# Patient Record
Sex: Male | Born: 1945 | Race: White | Hispanic: No | Marital: Married | Smoking: Never smoker
Health system: Southern US, Community
[De-identification: ages and names within clinical notes are randomized; demographics above are authoritative.]

## PROBLEM LIST (undated history)

## (undated) DIAGNOSIS — I10 Essential (primary) hypertension: Secondary | ICD-10-CM

## (undated) DIAGNOSIS — C801 Malignant (primary) neoplasm, unspecified: Secondary | ICD-10-CM

## (undated) DIAGNOSIS — K219 Gastro-esophageal reflux disease without esophagitis: Secondary | ICD-10-CM

## (undated) HISTORY — PX: HERNIA REPAIR: SHX51

---

## 2011-08-02 HISTORY — PX: ROBOT ASSISTED LAPAROSCOPIC RADICAL PROSTATECTOMY: SHX5141

## 2019-05-28 ENCOUNTER — Other Ambulatory Visit: Payer: Self-pay | Admitting: Orthopedic Surgery

## 2019-05-28 DIAGNOSIS — M170 Bilateral primary osteoarthritis of knee: Secondary | ICD-10-CM

## 2019-05-30 ENCOUNTER — Other Ambulatory Visit: Payer: Self-pay

## 2019-05-30 ENCOUNTER — Encounter (INDEPENDENT_AMBULATORY_CARE_PROVIDER_SITE_OTHER): Payer: Self-pay

## 2019-05-30 ENCOUNTER — Ambulatory Visit
Admission: RE | Admit: 2019-05-30 | Discharge: 2019-05-30 | Disposition: A | Discharge status: Home / Self Care | Payer: Medicare Other | Source: Ambulatory Visit | Attending: Orthopedic Surgery | Admitting: Orthopedic Surgery

## 2019-05-30 DIAGNOSIS — M170 Bilateral primary osteoarthritis of knee: Secondary | ICD-10-CM | POA: Diagnosis not present

## 2019-06-17 ENCOUNTER — Other Ambulatory Visit: Payer: Self-pay | Admitting: Orthopedic Surgery

## 2019-07-02 ENCOUNTER — Other Ambulatory Visit: Admission: RE | Admit: 2019-07-02 | Payer: Medicare Other | Source: Ambulatory Visit

## 2019-07-05 ENCOUNTER — Other Ambulatory Visit: Admission: RE | Admit: 2019-07-05 | Payer: Medicare Other | Source: Ambulatory Visit

## 2019-07-09 ENCOUNTER — Encounter: Admission: RE | Payer: Self-pay | Source: Home / Self Care

## 2019-07-09 ENCOUNTER — Inpatient Hospital Stay: Admission: RE | Admit: 2019-07-09 | Payer: Medicare Other | Source: Home / Self Care | Admitting: Orthopedic Surgery

## 2019-07-09 SURGERY — ARTHROPLASTY, KNEE, TOTAL
Anesthesia: Choice | Site: Knee | Laterality: Left

## 2019-07-24 ENCOUNTER — Other Ambulatory Visit: Payer: Self-pay | Admitting: Orthopedic Surgery

## 2019-07-31 ENCOUNTER — Inpatient Hospital Stay
Admission: RE | Admit: 2019-07-31 | Discharge: 2019-07-31 | Disposition: A | Discharge status: Home / Self Care | Payer: Medicare Other | Source: Ambulatory Visit

## 2019-07-31 ENCOUNTER — Encounter
Admission: RE | Admit: 2019-07-31 | Discharge: 2019-07-31 | Disposition: A | Discharge status: Home / Self Care | Payer: Medicare Other | Source: Ambulatory Visit | Attending: Orthopedic Surgery | Admitting: Orthopedic Surgery

## 2019-07-31 ENCOUNTER — Other Ambulatory Visit: Payer: Self-pay

## 2019-07-31 DIAGNOSIS — I10 Essential (primary) hypertension: Secondary | ICD-10-CM | POA: Insufficient documentation

## 2019-07-31 DIAGNOSIS — Z7982 Long term (current) use of aspirin: Secondary | ICD-10-CM | POA: Insufficient documentation

## 2019-07-31 DIAGNOSIS — Z01818 Encounter for other preprocedural examination: Secondary | ICD-10-CM | POA: Diagnosis present

## 2019-07-31 DIAGNOSIS — M170 Bilateral primary osteoarthritis of knee: Secondary | ICD-10-CM | POA: Diagnosis not present

## 2019-07-31 DIAGNOSIS — Z79899 Other long term (current) drug therapy: Secondary | ICD-10-CM | POA: Insufficient documentation

## 2019-07-31 DIAGNOSIS — E785 Hyperlipidemia, unspecified: Secondary | ICD-10-CM | POA: Diagnosis not present

## 2019-07-31 HISTORY — DX: Essential (primary) hypertension: I10

## 2019-07-31 HISTORY — DX: Gastro-esophageal reflux disease without esophagitis: K21.9

## 2019-07-31 HISTORY — DX: Malignant (primary) neoplasm, unspecified: C80.1

## 2019-07-31 LAB — URINALYSIS, ROUTINE W REFLEX MICROSCOPIC
Bilirubin Urine: NEGATIVE
Glucose, UA: NEGATIVE mg/dL
Hgb urine dipstick: NEGATIVE
Ketones, ur: NEGATIVE mg/dL
Leukocytes,Ua: NEGATIVE
Nitrite: NEGATIVE
Protein, ur: NEGATIVE mg/dL
Specific Gravity, Urine: 1.004 — ABNORMAL LOW (ref 1.005–1.030)
pH: 6 (ref 5.0–8.0)

## 2019-07-31 LAB — COMPREHENSIVE METABOLIC PANEL
ALT: 20 U/L (ref 0–44)
AST: 23 U/L (ref 15–41)
Albumin: 4.7 g/dL (ref 3.5–5.0)
Alkaline Phosphatase: 55 U/L (ref 38–126)
Anion gap: 12 (ref 5–15)
BUN: 23 mg/dL (ref 8–23)
CO2: 22 mmol/L (ref 22–32)
Calcium: 9.8 mg/dL (ref 8.9–10.3)
Chloride: 104 mmol/L (ref 98–111)
Creatinine, Ser: 1.17 mg/dL (ref 0.61–1.24)
GFR calc Af Amer: >60 mL/min (ref >60)
GFR calc non Af Amer: >60 mL/min (ref >60)
Glucose, Bld: 105 mg/dL — ABNORMAL HIGH (ref 70–99)
Potassium: 3.8 mmol/L (ref 3.5–5.1)
Sodium: 138 mmol/L (ref 135–145)
Total Bilirubin: 0.9 mg/dL (ref 0.3–1.2)
Total Protein: 7.3 g/dL (ref 6.5–8.1)

## 2019-07-31 LAB — CBC WITH DIFFERENTIAL/PLATELET
Abs Immature Granulocytes: 0.04 10*3/uL (ref 0.00–0.07)
Basophils Absolute: 0.1 10*3/uL (ref 0.0–0.1)
Basophils Relative: 1 %
Eosinophils Absolute: 0.6 10*3/uL — ABNORMAL HIGH (ref 0.0–0.5)
Eosinophils Relative: 7 %
HCT: 42.3 % (ref 39.0–52.0)
Hemoglobin: 15.4 g/dL (ref 13.0–17.0)
Immature Granulocytes: 1 %
Lymphocytes Relative: 24 %
Lymphs Abs: 2 10*3/uL (ref 0.7–4.0)
MCH: 30.9 pg (ref 26.0–34.0)
MCHC: 36.4 g/dL — ABNORMAL HIGH (ref 30.0–36.0)
MCV: 84.8 fL (ref 80.0–100.0)
Monocytes Absolute: 0.7 10*3/uL (ref 0.1–1.0)
Monocytes Relative: 9 %
Neutro Abs: 5 10*3/uL (ref 1.7–7.7)
Neutrophils Relative %: 58 %
Platelets: 206 10*3/uL (ref 150–400)
RBC: 4.99 MIL/uL (ref 4.22–5.81)
RDW: 12.4 % (ref 11.5–15.5)
WBC: 8.4 10*3/uL (ref 4.0–10.5)
nRBC: 0 % (ref 0.0–0.2)

## 2019-07-31 LAB — SURGICAL PCR SCREEN
MRSA, PCR: NEGATIVE
Staphylococcus aureus: NEGATIVE

## 2019-07-31 NOTE — Patient Instructions (Signed)
Your COVID swab is scheduled on:  Monday 08/05/2019. Drive up any time [id number] am in front of the [facility] and remain in your vehicle.  Your procedure is scheduled on: Tuesday 08/06/2019. Report to Same Day Surgery 2nd floor [facility] Entrance-take elevator on left to 2nd floor.  Check in with surgery information desk.) To find out your arrival time, call [phone] 1:00-3:00 PM on Monday 08/05/2019  Remember: Instructions that are not followed completely may result in serious medical risk, up to and including death, or upon the discretion of your surgeon and anesthesiologist your surgery may need to be rescheduled.    __x__ 1. Do not eat food (including mints, candies, chewing gum) after midnight the night before your procedure. You may drink clear liquids up to 2 hours before you are scheduled to arrive at the hospital for your procedure.  Do not drink anything within 2 hours of your scheduled arrival to the hospital.  Approved clear liquids:  --Water or Apple juice without pulp  --Clear carbohydrate beverage such as Gatorade or Powerade  --Black Coffee or Clear Tea (No milk, no creamers, do not add anything to the coffee or tea)   __x__ 2. Finish your provided clear Ensure carb drink 2 hours before your scheduled arrival.    __x__ 3. No Alcohol or Smoking 24 hours before surgery.     __x__ 4. Notify your doctor if there is any change in your medical condition (cold, fever, infections).   __x__ 5. On the morning of surgery brush your teeth with toothpaste and water.  You may rinse your mouth with mouthwash if you wish.  Do not swallow any toothpaste or mouthwash.  Please read over the following fact sheets that you were given:   [facility] Preparing for Surgery and/or MRSA Information    __x__ Use CHG Soap as directed on instruction sheet .   Do not wear jewelry, lotions, powders, deodorant, or perfumes on the day of surgery.  Do not shave below the  face/neck 48 hours prior to surgery.   Do not bring valuables to the hospital.  [facility] is not responsible for any belongings or valuables.               Eyeglasses may not be worn into surgery.  For patients discharged on the day of surgery, you will NOT be permitted to drive yourself home.  You must have a responsible adult with you for 24 hours after surgery.  __x__ Take these medicines on the morning of surgery with a SMALL SIP OF WATER:  1. Famotidine  __x__ If applicable, follow recommendations from Cardiologist, Pulmonologist or PCP regarding stopping blood thinners such as Aspirin, Coumadin, Plavix, Eliquis, Effient, Pradaxa, and Pletal.  __x__ STARTING TODAY: Do not take Anti-inflammatories such as Aleve, Naproxen, Advil, Ibuprofen, Motrin, Naprosyn, BC/Goodies powders or aspirin products. You CAN take Tylenol if needed   __x__ STARTING TODAY: Do not take any over the counter supplements until after surgery.

## 2019-08-05 ENCOUNTER — Other Ambulatory Visit
Admission: RE | Admit: 2019-08-05 | Discharge: 2019-08-05 | Disposition: A | Discharge status: Home / Self Care | Payer: Medicare Other | Source: Ambulatory Visit | Attending: Orthopedic Surgery | Admitting: Orthopedic Surgery

## 2019-08-05 ENCOUNTER — Other Ambulatory Visit: Payer: Self-pay

## 2019-08-05 DIAGNOSIS — Z01812 Encounter for preprocedural laboratory examination: Secondary | ICD-10-CM | POA: Insufficient documentation

## 2019-08-05 DIAGNOSIS — Z20822 Contact with and (suspected) exposure to covid-19: Secondary | ICD-10-CM | POA: Diagnosis not present

## 2019-08-05 LAB — SARS CORONAVIRUS 2 (TAT 6-24 HRS): SARS Coronavirus 2: NEGATIVE

## 2019-08-05 LAB — TYPE AND SCREEN
ABO/RH(D): A POS
Antibody Screen: NEGATIVE

## 2019-08-06 ENCOUNTER — Encounter
Admission: RE | Disposition: A | Discharge status: Home / Self Care | Payer: Self-pay | Source: Home / Self Care | Attending: Orthopedic Surgery

## 2019-08-06 ENCOUNTER — Ambulatory Visit
Admission: RE | Admit: 2019-08-06 | Discharge: 2019-08-06 | Disposition: A | Discharge status: Home / Self Care | Payer: Medicare Other | Attending: Orthopedic Surgery | Admitting: Orthopedic Surgery

## 2019-08-06 ENCOUNTER — Encounter: Payer: Self-pay | Admitting: Orthopedic Surgery

## 2019-08-06 ENCOUNTER — Other Ambulatory Visit: Payer: Self-pay

## 2019-08-06 ENCOUNTER — Ambulatory Visit: Payer: Medicare Other | Admitting: Anesthesiology

## 2019-08-06 ENCOUNTER — Ambulatory Visit: Payer: Medicare Other

## 2019-08-06 DIAGNOSIS — M1712 Unilateral primary osteoarthritis, left knee: Secondary | ICD-10-CM | POA: Diagnosis present

## 2019-08-06 DIAGNOSIS — Z820 Family history of epilepsy and other diseases of the nervous system: Secondary | ICD-10-CM | POA: Diagnosis not present

## 2019-08-06 DIAGNOSIS — I10 Essential (primary) hypertension: Secondary | ICD-10-CM | POA: Insufficient documentation

## 2019-08-06 DIAGNOSIS — Z8546 Personal history of malignant neoplasm of prostate: Secondary | ICD-10-CM | POA: Insufficient documentation

## 2019-08-06 DIAGNOSIS — Z7982 Long term (current) use of aspirin: Secondary | ICD-10-CM | POA: Insufficient documentation

## 2019-08-06 DIAGNOSIS — K219 Gastro-esophageal reflux disease without esophagitis: Secondary | ICD-10-CM | POA: Insufficient documentation

## 2019-08-06 DIAGNOSIS — Z801 Family history of malignant neoplasm of trachea, bronchus and lung: Secondary | ICD-10-CM | POA: Diagnosis not present

## 2019-08-06 DIAGNOSIS — Z79899 Other long term (current) drug therapy: Secondary | ICD-10-CM | POA: Insufficient documentation

## 2019-08-06 DIAGNOSIS — G8918 Other acute postprocedural pain: Secondary | ICD-10-CM

## 2019-08-06 DIAGNOSIS — E785 Hyperlipidemia, unspecified: Secondary | ICD-10-CM | POA: Insufficient documentation

## 2019-08-06 DIAGNOSIS — Z8249 Family history of ischemic heart disease and other diseases of the circulatory system: Secondary | ICD-10-CM | POA: Insufficient documentation

## 2019-08-06 DIAGNOSIS — Z8371 Family history of colonic polyps: Secondary | ICD-10-CM | POA: Diagnosis not present

## 2019-08-06 HISTORY — PX: TOTAL KNEE ARTHROPLASTY: SHX125

## 2019-08-06 LAB — ABO/RH: ABO/RH(D): A POS

## 2019-08-06 SURGERY — ARTHROPLASTY, KNEE, TOTAL
Anesthesia: Spinal | Site: Knee | Laterality: Left

## 2019-08-06 MED ORDER — BUPIVACAINE HCL (PF) 0.25 % IJ SOLN
INTRAMUSCULAR | Status: AC
  Filled 2019-08-06: qty 30

## 2019-08-06 MED ORDER — NEOMYCIN-POLYMYXIN B GU 40-200000 IR SOLN
Status: DC | PRN
  Administered 2019-08-06: 4 mL

## 2019-08-06 MED ORDER — LACTATED RINGERS IV BOLUS
250.0000 mL | Freq: Once | INTRAVENOUS | Status: AC
  Administered 2019-08-06: 250 mL via INTRAVENOUS

## 2019-08-06 MED ORDER — ONDANSETRON HCL 4 MG/2ML IJ SOLN
INTRAMUSCULAR | Status: AC
  Filled 2019-08-06: qty 2

## 2019-08-06 MED ORDER — METHOCARBAMOL 1000 MG/10ML IJ SOLN: 500.0000 mg | Freq: Four times a day (QID) | INTRAVENOUS | Status: DC | PRN

## 2019-08-06 MED ORDER — ONDANSETRON HCL 4 MG/2ML IJ SOLN: 4.0000 mg | Freq: Four times a day (QID) | INTRAMUSCULAR | Status: DC | PRN

## 2019-08-06 MED ORDER — METOCLOPRAMIDE HCL 10 MG PO TABS: 5.0000 mg | ORAL_TABLET | Freq: Three times a day (TID) | ORAL | Status: DC | PRN

## 2019-08-06 MED ORDER — BUPIVACAINE LIPOSOME 1.3 % IJ SUSP
INTRAMUSCULAR | Status: AC
  Filled 2019-08-06: qty 20

## 2019-08-06 MED ORDER — PROPOFOL 10 MG/ML IV BOLUS
INTRAVENOUS | Status: AC
  Filled 2019-08-06: qty 20

## 2019-08-06 MED ORDER — LACTATED RINGERS IV SOLN: INTRAVENOUS | Status: DC

## 2019-08-06 MED ORDER — HYDROCODONE-ACETAMINOPHEN 7.5-325 MG PO TABS: 1.0000 | ORAL_TABLET | ORAL | Status: DC | PRN

## 2019-08-06 MED ORDER — METOCLOPRAMIDE HCL 5 MG/ML IJ SOLN: 5.0000 mg | Freq: Three times a day (TID) | INTRAMUSCULAR | Status: DC | PRN

## 2019-08-06 MED ORDER — TRAMADOL HCL 50 MG PO TABS
50.0000 mg | ORAL_TABLET | Freq: Four times a day (QID) | ORAL | Status: DC
  Administered 2019-08-06: 50 mg via ORAL

## 2019-08-06 MED ORDER — ONDANSETRON HCL 4 MG PO TABS: 4.0000 mg | ORAL_TABLET | Freq: Four times a day (QID) | ORAL | Status: DC | PRN

## 2019-08-06 MED ORDER — SODIUM CHLORIDE (PF) 0.9 % IJ SOLN
INTRAMUSCULAR | Status: AC
  Filled 2019-08-06: qty 50

## 2019-08-06 MED ORDER — ONDANSETRON HCL 4 MG/2ML IJ SOLN
4.0000 mg | Freq: Once | INTRAMUSCULAR | Status: AC | PRN
  Administered 2019-08-06: 4 mg via INTRAVENOUS

## 2019-08-06 MED ORDER — CEFAZOLIN SODIUM-DEXTROSE 2-4 GM/100ML-% IV SOLN
2.0000 g | INTRAVENOUS | Status: AC
  Administered 2019-08-06: 08:00:00 2 g via INTRAVENOUS

## 2019-08-06 MED ORDER — HYDROCODONE-ACETAMINOPHEN 7.5-325 MG PO TABS: 1.0000 | ORAL_TABLET | Freq: Four times a day (QID) | ORAL | 0 refills | Status: DC | PRN

## 2019-08-06 MED ORDER — ENOXAPARIN SODIUM 40 MG/0.4ML ~~LOC~~ SOLN: 40.0000 mg | SUBCUTANEOUS | 0 refills | Status: DC

## 2019-08-06 MED ORDER — ACETAMINOPHEN 325 MG PO TABS: 325.0000 mg | ORAL_TABLET | Freq: Four times a day (QID) | ORAL | Status: DC | PRN

## 2019-08-06 MED ORDER — METHOCARBAMOL 500 MG PO TABS: 500.0000 mg | ORAL_TABLET | Freq: Four times a day (QID) | ORAL | Status: DC | PRN

## 2019-08-06 MED ORDER — FENTANYL CITRATE (PF) 100 MCG/2ML IJ SOLN
25.0000 ug | INTRAMUSCULAR | Status: DC | PRN
  Administered 2019-08-06 (×2): 25 ug via INTRAVENOUS

## 2019-08-06 MED ORDER — MORPHINE SULFATE 10 MG/ML IJ SOLN
INTRAMUSCULAR | Status: DC | PRN
  Administered 2019-08-06: 10 mg

## 2019-08-06 MED ORDER — HYDROCODONE-ACETAMINOPHEN 5-325 MG PO TABS: 1.0000 | ORAL_TABLET | ORAL | Status: DC | PRN

## 2019-08-06 MED ORDER — PROPOFOL 500 MG/50ML IV EMUL
INTRAVENOUS | Status: AC
  Filled 2019-08-06: qty 50

## 2019-08-06 MED ORDER — SODIUM CHLORIDE 0.9 % IV SOLN: INTRAVENOUS | Status: DC

## 2019-08-06 MED ORDER — ENOXAPARIN SODIUM 30 MG/0.3ML ~~LOC~~ SOLN: 30.0000 mg | Freq: Two times a day (BID) | SUBCUTANEOUS | Status: DC

## 2019-08-06 MED ORDER — SODIUM CHLORIDE 0.9 % IV SOLN
INTRAVENOUS | Status: DC | PRN
  Administered 2019-08-06: 60 mL

## 2019-08-06 MED ORDER — MENTHOL 3 MG MT LOZG: 1.0000 | LOZENGE | OROMUCOSAL | Status: DC | PRN

## 2019-08-06 MED ORDER — CEFAZOLIN SODIUM-DEXTROSE 2-4 GM/100ML-% IV SOLN
INTRAVENOUS | Status: AC
  Filled 2019-08-06: qty 100

## 2019-08-06 MED ORDER — FENTANYL CITRATE (PF) 100 MCG/2ML IJ SOLN
INTRAMUSCULAR | Status: AC
  Administered 2019-08-06: 25 ug via INTRAVENOUS
  Filled 2019-08-06: qty 2

## 2019-08-06 MED ORDER — PROPOFOL 10 MG/ML IV BOLUS
INTRAVENOUS | Status: DC | PRN
  Administered 2019-08-06: 50 mg via INTRAVENOUS

## 2019-08-06 MED ORDER — NEOMYCIN-POLYMYXIN B GU 40-200000 IR SOLN
Status: AC
  Filled 2019-08-06: qty 20

## 2019-08-06 MED ORDER — CHLORHEXIDINE GLUCONATE 4 % EX LIQD
60.0000 mL | Freq: Once | CUTANEOUS | Status: AC
  Administered 2019-08-06: 4 via TOPICAL

## 2019-08-06 MED ORDER — DOCUSATE SODIUM 100 MG PO CAPS: 100.0000 mg | ORAL_CAPSULE | Freq: Two times a day (BID) | ORAL | Status: DC

## 2019-08-06 MED ORDER — SODIUM CHLORIDE 0.9 % IV SOLN
INTRAVENOUS | Status: DC | PRN
  Administered 2019-08-06: 08:00:00 50 ug/min via INTRAVENOUS

## 2019-08-06 MED ORDER — PROPOFOL 500 MG/50ML IV EMUL
INTRAVENOUS | Status: DC | PRN
  Administered 2019-08-06: 100 ug/kg/min via INTRAVENOUS

## 2019-08-06 MED ORDER — LACTATED RINGERS IV BOLUS
500.0000 mL | Freq: Once | INTRAVENOUS | Status: AC
  Administered 2019-08-06: 500 mL via INTRAVENOUS

## 2019-08-06 MED ORDER — BUPIVACAINE-EPINEPHRINE (PF) 0.25% -1:200000 IJ SOLN
INTRAMUSCULAR | Status: DC | PRN
  Administered 2019-08-06: 30 mL

## 2019-08-06 MED ORDER — EPINEPHRINE PF 1 MG/ML IJ SOLN
INTRAMUSCULAR | Status: AC
  Filled 2019-08-06: qty 1

## 2019-08-06 MED ORDER — ALUM & MAG HYDROXIDE-SIMETH 200-200-20 MG/5ML PO SUSP: 30.0000 mL | ORAL | Status: DC | PRN

## 2019-08-06 MED ORDER — BUPIVACAINE HCL (PF) 0.5 % IJ SOLN
INTRAMUSCULAR | Status: DC | PRN
  Administered 2019-08-06: 2.5 mL

## 2019-08-06 MED ORDER — MIDAZOLAM HCL 5 MG/5ML IJ SOLN
INTRAMUSCULAR | Status: DC | PRN
  Administered 2019-08-06: 2 mg via INTRAVENOUS

## 2019-08-06 MED ORDER — PHENOL 1.4 % MT LIQD: 1.0000 | OROMUCOSAL | Status: DC | PRN

## 2019-08-06 MED ORDER — MIDAZOLAM HCL 2 MG/2ML IJ SOLN
INTRAMUSCULAR | Status: AC
  Filled 2019-08-06: qty 2

## 2019-08-06 MED ORDER — ONDANSETRON 4 MG PO TBDP: 4.0000 mg | ORAL_TABLET | Freq: Three times a day (TID) | ORAL | 0 refills | Status: DC | PRN

## 2019-08-06 MED ORDER — MORPHINE SULFATE (PF) 10 MG/ML IV SOLN
INTRAVENOUS | Status: AC
  Filled 2019-08-06: qty 1

## 2019-08-06 MED ORDER — TRAMADOL HCL 50 MG PO TABS
ORAL_TABLET | ORAL | Status: AC
  Filled 2019-08-06: qty 1

## 2019-08-06 MED ORDER — SODIUM CHLORIDE (PF) 0.9 % IJ SOLN
INTRAMUSCULAR | Status: DC | PRN
  Administered 2019-08-06: 12 mL

## 2019-08-06 MED ORDER — SODIUM CHLORIDE FLUSH 0.9 % IV SOLN
INTRAVENOUS | Status: AC
  Filled 2019-08-06: qty 20

## 2019-08-06 MED ORDER — CEFAZOLIN SODIUM-DEXTROSE 2-4 GM/100ML-% IV SOLN
2.0000 g | Freq: Four times a day (QID) | INTRAVENOUS | Status: DC
  Administered 2019-08-06: 15:00:00 2 g via INTRAVENOUS

## 2019-08-06 MED ORDER — MORPHINE SULFATE (PF) 4 MG/ML IV SOLN: 0.5000 mg | INTRAVENOUS | Status: DC | PRN

## 2019-08-06 SURGICAL SUPPLY — 73 items
BLADE SAGITTAL 25.0X1.19X90 (BLADE) ×2 IMPLANT
BLADE SAGITTAL 25.0X1.19X90MM (BLADE) ×1
BLOCK CUTTING FEMUR 4 LT MED (MISCELLANEOUS) ×3 IMPLANT
BLOCK CUTTING TIBIAL 4 LT CT (MISCELLANEOUS) ×3 IMPLANT
BNDG ELASTIC 6X5.8 VLCR STR LF (GAUZE/BANDAGES/DRESSINGS) ×3 IMPLANT
CANISTER SUCT 1200ML W/VALVE (MISCELLANEOUS) ×3 IMPLANT
CANISTER SUCT 3000ML PPV (MISCELLANEOUS) ×6 IMPLANT
CANISTER WOUND CARE 500ML ATS (WOUND CARE) IMPLANT
CEMENT HV SMART SET (Cement) ×6 IMPLANT
CHLORAPREP W/TINT 26 (MISCELLANEOUS) ×6 IMPLANT
COOLER POLAR GLACIER W/PUMP (MISCELLANEOUS) ×3 IMPLANT
COVER WAND RF STERILE (DRAPES) ×3 IMPLANT
CUFF TOURN SGL QUICK 30 (TOURNIQUET CUFF) ×2
CUFF TRNQT CYL 30X4X21-28X (TOURNIQUET CUFF) ×1 IMPLANT
DRAPE 3/4 80X56 (DRAPES) ×6 IMPLANT
DRSG MEPILEX SACRM 8.7X9.8 (GAUZE/BANDAGES/DRESSINGS) ×3 IMPLANT
DRSG OPSITE POSTOP 4X10 (GAUZE/BANDAGES/DRESSINGS) ×6 IMPLANT
ELECT CAUTERY BLADE 6.4 (BLADE) ×3 IMPLANT
ELECT REM PT RETURN 9FT ADLT (ELECTROSURGICAL) ×3
ELECTRODE REM PT RTRN 9FT ADLT (ELECTROSURGICAL) ×1 IMPLANT
FEMORAL COMP SZ4 LEFT SPHERE (Femur) ×3 IMPLANT
FEMUR BONE MODEL 4.9010 MEDACT (MISCELLANEOUS) ×3 IMPLANT
GAUZE SPONGE 4X4 12PLY STRL (GAUZE/BANDAGES/DRESSINGS) ×3 IMPLANT
GAUZE XEROFORM 1X8 LF (GAUZE/BANDAGES/DRESSINGS) ×3 IMPLANT
GLOVE BIOGEL PI IND STRL 9 (GLOVE) ×1 IMPLANT
GLOVE BIOGEL PI INDICATOR 9 (GLOVE) ×2
GLOVE INDICATOR 8.0 STRL GRN (GLOVE) ×3 IMPLANT
GLOVE SURG ORTHO 8.0 STRL STRW (GLOVE) ×3 IMPLANT
GLOVE SURG SYN 9.0  PF PI (GLOVE) ×4
GLOVE SURG SYN 9.0 PF PI (GLOVE) ×2 IMPLANT
GOWN SRG 2XL LVL 4 RGLN SLV (GOWNS) ×1 IMPLANT
GOWN STRL NON-REIN 2XL LVL4 (GOWNS) ×2
GOWN STRL REUS W/ TWL LRG LVL3 (GOWN DISPOSABLE) ×1 IMPLANT
GOWN STRL REUS W/ TWL XL LVL3 (GOWN DISPOSABLE) ×1 IMPLANT
GOWN STRL REUS W/TWL LRG LVL3 (GOWN DISPOSABLE) ×2
GOWN STRL REUS W/TWL XL LVL3 (GOWN DISPOSABLE) ×2
HOLDER FOLEY CATH W/STRAP (MISCELLANEOUS) ×3 IMPLANT
HOOD PEEL AWAY FLYTE STAYCOOL (MISCELLANEOUS) ×9 IMPLANT
KIT PREVENA INCISION MGT20CM45 (CANNISTER) IMPLANT
KIT TURNOVER KIT A (KITS) ×3 IMPLANT
NDL SAFETY ECLIPSE 18X1.5 (NEEDLE) ×1 IMPLANT
NEEDLE HYPO 18GX1.5 SHARP (NEEDLE) ×2
NEEDLE SPNL 18GX3.5 QUINCKE PK (NEEDLE) ×3 IMPLANT
NEEDLE SPNL 20GX3.5 QUINCKE YW (NEEDLE) ×3 IMPLANT
NS IRRIG 1000ML POUR BTL (IV SOLUTION) ×3 IMPLANT
PACK TOTAL KNEE (MISCELLANEOUS) ×3 IMPLANT
PAD CAST CTTN 4X4 STRL (SOFTGOODS) ×2 IMPLANT
PAD WRAPON POLAR KNEE (MISCELLANEOUS) ×1 IMPLANT
PADDING CAST COTTON 4X4 STRL (SOFTGOODS) ×4
PATELLA RESURFACING MEDACTA 02 (Bone Implant) ×3 IMPLANT
PENCIL SMOKE EVACUATOR COATED (MISCELLANEOUS) ×3 IMPLANT
PULSAVAC PLUS IRRIG FAN TIP (DISPOSABLE) ×3
SCALPEL PROTECTED #10 DISP (BLADE) ×6 IMPLANT
SOL .9 NS 3000ML IRR  AL (IV SOLUTION) ×2
SOL .9 NS 3000ML IRR UROMATIC (IV SOLUTION) ×1 IMPLANT
STAPLER SKIN PROX 35W (STAPLE) ×3 IMPLANT
STEM EXTENSION 11MMX30MM (Stem) ×3 IMPLANT
SUCTION FRAZIER HANDLE 10FR (MISCELLANEOUS) ×2
SUCTION TUBE FRAZIER 10FR DISP (MISCELLANEOUS) ×1 IMPLANT
SUT DVC 2 QUILL PDO  T11 36X36 (SUTURE) ×2
SUT DVC 2 QUILL PDO T11 36X36 (SUTURE) ×1 IMPLANT
SUT ETHIBOND 2 V 37 (SUTURE) IMPLANT
SUT V-LOC 90 ABS DVC 3-0 CL (SUTURE) ×3 IMPLANT
SYR 20ML LL LF (SYRINGE) ×3 IMPLANT
SYR 50ML LL SCALE MARK (SYRINGE) ×6 IMPLANT
TIBIAL BONE MODEL LEFT (MISCELLANEOUS) ×3 IMPLANT
TIBIAL INSERT SZ4 LT 02120410F (Insert) ×3 IMPLANT
TIBIAL TRAY FIXED MEDACTA 0207 (Bone Implant) ×3 IMPLANT
TIP FAN IRRIG PULSAVAC PLUS (DISPOSABLE) ×1 IMPLANT
TOWEL OR 17X26 4PK STRL BLUE (TOWEL DISPOSABLE) ×3 IMPLANT
TOWER CARTRIDGE SMART MIX (DISPOSABLE) ×3 IMPLANT
TRAY FOLEY MTR SLVR 16FR STAT (SET/KITS/TRAYS/PACK) ×3 IMPLANT
WRAPON POLAR PAD KNEE (MISCELLANEOUS) ×3

## 2019-08-06 NOTE — Discharge Instructions (Addendum)
Total Knee Replacement, Care After This sheet gives you information about how to care for yourself after your procedure. Your doctor may also give you more specific instructions. If you have problems or questions, contact your doctor. What can I expect after the procedure? After the procedure, it is common to have:  Pain.  Swelling.  A small amount of blood coming from your cut from surgery (incision).  Clear fluid coming from your cut from surgery.  Limited movement of your knee. Follow these instructions at home: Medicines  Take over-the-counter and prescription medicines only as told by your doctor.  If you were prescribed a blood thinner (anticoagulant), take it as told by your doctor.  Ask your doctor if the medicine prescribed to you: ? Requires you to avoid driving or using heavy machinery. ? Can cause trouble pooping (constipation). You may need to take steps to prevent or treat trouble pooping:  Drink enough fluid to keep your pee (urine) pale yellow.  Take over-the-counter or prescription medicines.  Eat foods that are high in fiber. These include beans, whole grains, and fresh fruits and vegetables.  Limit foods that are high in fat and sugar. These include fried or sweet foods. Bathing  Do not take baths, swim, or use a hot tub until your doctor approves. Ask your doctor if you may take showers. You may only be allowed to take sponge baths.  Keep your bandage (dressing) dry until your doctor says it can be taken off. Incision care and drain care   Follow instructions from your doctor about how to take care of your cut from surgery. Make sure you: ? Wash your hands with soap and water before and after you change your bandage. If you cannot use soap and water, use hand sanitizer. ? Change your bandage as told by your doctor. ? Leave stitches (sutures), skin glue, or skin tape (adhesive) strips in place. They may need to stay in place for 2 weeks or longer. If tape  strips get loose and curl up, you may trim the loose edges. Do not remove tape strips completely unless your doctor says it is okay.  Check your cut from surgery and your drain site every day for signs of infection. Check for: ? More redness, swelling, or pain. ? More fluid or blood. ? Warmth. ? Pus or a bad smell.  If you have a drain, follow instructions from your doctor about caring for it. Managing pain, stiffness, and swelling      If told, put ice on your knee. ? Put ice in a plastic bag or use the icing device (cold flow pad or cryocuff) that you were given. Follow your doctor's directions about how to use the icing device. ? Place a towel between your skin and the bag or between your skin and the icing device. ? Leave the ice on for 20 minutes, 2-3 times per day.  If told, put heat on your knee before you exercise. Use the heat source that your doctor recommends, such as a moist heat pack or a heating pad. ? Place a towel between your skin and the heat source. ? Leave the heat on for 20-30 minutes. ? Remove the heat if your skin turns bright red. This is very important if you are unable to feel pain, heat, or cold. You may have a greater risk of getting burned.  Move your toes often.  Raise (elevate) your knee above the level of your heart while you are sitting or   lying down. ? Use several pillows to keep your leg straight. ? Do not put a pillow just under the knee. If the knee is bent for a long time, this may make the knee stiff.  Wear elastic knee support as told by your doctor. Activity  Rest as told by your doctor.  Do not sit for a long time without moving. Get up to take short walks every 1-2 hours. This is important. Ask for help if you feel weak or unsteady.  Ask your doctor what activities are safe for you.  Avoid activities that put stress on your knees. These include running, jumping rope, and jumping jacks.  Do not play contact sports until your doctor  says it is okay.  Do exercises as told by your physical therapist.  If you have been sent home with a knee joint motion machine (continuous passive motion machine), use it as told by your doctor. Safety   Do not use your leg to support your body weight until your doctor says that you can. Use crutches or a walker as told by your doctor.  Do not drive until your doctor says it is okay. Ask your doctor when it is safe to drive. General instructions  Do not use any products that contain nicotine or tobacco, such as cigarettes, e-cigarettes, and chewing tobacco. These can delay healing. If you need help quitting, ask your doctor.  Wear special socks (compression stockings) as told by your doctor.  Tell your doctor if you plan to have dental work. Also: ? Tell your dentist about your joint replacement. ? Ask your doctor if there are instructions you need to follow before dental care and routine cleanings.  Keep all follow-up visits as told by your doctor. This is important. Contact a doctor if:  You have more redness, swelling, or pain around your cut from surgery or your drain.  You have more fluid or blood coming from your cut from surgery or your drain.  You have pus or a bad smell coming from your cut from surgery or your drain.  Your cut from surgery or your drain area feels warm to the touch.  You have a fever.  Your cut breaks open.  You have knee pain that does not go away.  The movement of your knee is getting worse.  Your new joint feels loose. Get help right away if you have:  Pain in your calf or thigh.  Swelling in your calf or thigh.  Shortness of breath.  Trouble breathing.  Chest pain. Summary  After the procedure, it is common to have pain and swelling, blood or fluid coming from your cut from surgery, and trouble moving your knee.  Follow instructions from your doctor about how to take care of your cut from surgery.  Use crutches or a walker as  told by your doctor.  If you were prescribed a blood thinner, take it as told by your doctor.  Keep all follow-up visits as told by your doctor. This is important. This information is not intended to replace advice given to you by your health care provider. Make sure you discuss any questions you have with your health care provider. Document Revised: 11/26/2018 Document Reviewed: 03/01/2018 Elsevier Patient Education  2020 [place]   1) The drugs that you were given will stay in your system until tomorrow so for the next 24 hours you should not:  A) Drive an automobile B)  Make any legal decisions C) Drink any alcoholic beverage   2) You may resume regular meals tomorrow.  Today it is better to start with liquids and gradually work up to solid foods.  You may eat anything you prefer, but it is better to start with liquids, then soup and crackers, and gradually work up to solid foods.   3) Please notify your doctor immediately if you have any unusual bleeding, trouble breathing, redness and pain at the surgery site, drainage, fever, or pain not relieved by medication.    4) Additional Instructions:        Please contact your physician with any problems or Same Day Surgery at [phone], Monday through Friday 6 am to 4 pm, or [place] at [facility] number at [phone].

## 2019-08-06 NOTE — Anesthesia Procedure Notes (Signed)
Date/Time: 08/06/2019 7:30 AM Performed by: Nelda Marseille, CRNA Pre-anesthesia Checklist: Patient identified, Emergency Drugs available, Suction available, Patient being monitored and Timeout performed Oxygen Delivery Method: Simple face mask

## 2019-08-06 NOTE — Progress Notes (Signed)
zofran given for nausea  

## 2019-08-06 NOTE — Evaluation (Signed)
Physical Therapy Evaluation Patient Details Name: Lee Hutchinson MRN: [id number] DOB: 11-Jul-1946 Today's Date: 08/06/2019   History of Present Illness  74 y/o male seen in PACU s/p L TKA, expected POD0 d/c.  Clinical Impression  Pt did well with POD0 PT exam and showed good mobility, ability to ambulate >100 ft and negotiated up/down steps to be able to safely get in/out of home.  He showed great effort despite some post-op nausea.  He was not able to attain full knee extension, but showed good quad engagement and was able to do SLRs against gravity with only minimal warm up.  Pt with many questions, all answered by PT.  Issued and performed HEP and issued walker and 3-in-1 for in home use.      Follow Up Recommendations Home health PT    Equipment Recommendations  Rolling walker with 5" wheels;3in1 (PT)    Recommendations for Other Services       Precautions / Restrictions Precautions Precautions: Knee;Fall Precaution Booklet Issued: Yes (comment)(HEP) Restrictions Weight Bearing Restrictions: Yes LLE Weight Bearing: Weight bearing as tolerated      Mobility  Bed Mobility Overal bed mobility: Modified Independent             General bed mobility comments: Slow, but did not need assist to get to EOB  Transfers Overall transfer level: Modified independent Equipment used: Rolling walker (2 wheeled)             General transfer comment: minimal cuing to insure appropriate set up and sequencing, did not need assist to attain standing  Ambulation/Gait Ambulation/Gait assistance: Min guard Gait Distance (Feet): 150 Feet Assistive device: Rolling walker (2 wheeled)       General Gait Details: Pt initially with expected stop-go cadence with need for deliberate focus with L stance phase, but by end of the effort was able to maintain consistent cadence and forward motion  Stairs Stairs: Yes Stairs assistance: Min guard Stair Management: No rails Number of Stairs:  1 General stair comments: pt able to perform backward negotiation with walker, educated on single step going forward.  Good understanding of up with stronger, down with weaker strategy  Wheelchair Mobility    Modified Rankin (Stroke Patients Only)       Balance Overall balance assessment: Modified Independent                                           Pertinent Vitals/Pain Pain Assessment: 0-10 Pain Score: 4 (increases with ROM tasks) Pain Location: L knee Pain Intervention(s): Premedicated before session;Monitored during session    Home Living Family/patient expects to be discharged to:: Private residence Living Arrangements: Spouse/significant other Available Help at Discharge: Available 24 hours/day;Family   Home Access: Stairs to enter Entrance Stairs-Rails: None Entrance Stairs-Number of Steps: 1 Home Layout: One level Home Equipment: None      Prior Function Level of Independence: Independent         Comments: 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, newspaper        Extremity/Trunk Assessment   Upper Extremity Assessment Upper Extremity Assessment: Overall WFL for tasks assessed    Lower Extremity Assessment Lower Extremity Assessment: Overall WFL for tasks assessed(expected post-op weakness, lacks ~4 deg of ext)       Communication   Communication: No difficulties  Cognition Arousal/Alertness: Awake/alert(still minimally "groggy" from  surgery) Behavior During Therapy: WFL for tasks assessed/performed Overall Cognitive Status: Within Functional Limits for tasks assessed                                 General Comments: Pt was eager to work with PT and highly engaged despite some nausea      General Comments General comments (skin integrity, edema, etc.): pt with some limited nausea with initially getting up but only minimal and short lived dry-heaving    Exercises Total Joint Exercises Ankle  Circles/Pumps: Strengthening;10 reps Quad Sets: Strengthening;15 reps Short Arc Quad: AAROM;AROM;10 reps Heel Slides: AROM;10 reps(with resisted leg extensions) Hip ABduction/ADduction: Strengthening;10 reps Straight Leg Raises: AROM;10 reps Knee Flexion: PROM;5 reps Goniometric ROM: 4~70   Assessment/Plan    PT Assessment Patient needs continued PT services  PT Problem List Decreased strength;Decreased range of motion;Decreased activity tolerance;Decreased balance;Decreased mobility;Decreased knowledge of use of DME;Decreased safety awareness;Pain       PT Treatment Interventions DME instruction;Gait training;Stair training;Functional mobility training;Therapeutic activities;Therapeutic exercise;Balance training;Neuromuscular re-education;Patient/family education    PT Goals (Current goals can be found in the Care Plan section)  Acute Rehab PT Goals Patient Stated Goal: go home and get back to normal PT Goal Formulation: With patient Time For Goal Achievement: 08/20/19 Potential to Achieve Goals: Good    Frequency BID   Barriers to discharge        Co-evaluation               AM-PAC PT "6 Clicks" Mobility  Outcome Measure Help needed turning from your back to your side while in a flat bed without using bedrails?: None Help needed moving from lying on your back to sitting on the side of a flat bed without using bedrails?: None Help needed moving to and from a bed to a chair (including a wheelchair)?: None Help needed standing up from a chair using your arms (e.g., wheelchair or bedside chair)?: None Help needed to walk in hospital room?: A Little Help needed climbing 3-5 steps with a railing? : A Little 6 Click Score: 22    End of Session Equipment Utilized During Treatment: Gait belt Activity Tolerance: Patient tolerated treatment well Patient left: in chair(nursing taking him out of PACU in recliner) Nurse Communication: Mobility status PT Visit Diagnosis:  Muscle weakness (generalized) (M62.81);Difficulty in walking, not elsewhere classified (R26.2);Pain Pain - Right/Left: Left Pain - part of body: Knee    Time: 1155-1250 PT Time Calculation (min) (ACUTE ONLY): 55 min   Charges:   PT Evaluation $PT Eval Moderate Complexity: 1 Mod PT Treatments $Gait Training: 8-22 mins $Therapeutic Exercise: 8-22 mins        Kreg Shropshire, DPT 08/06/2019, 2:27 PM

## 2019-08-06 NOTE — Progress Notes (Signed)
Physical therapy into work with pt

## 2019-08-06 NOTE — OR Nursing (Signed)
Honeycomb drsgs x 2 sent home with patient as instructed by MD.   Bone foam instructions given and sent home with patient.

## 2019-08-06 NOTE — Anesthesia Preprocedure Evaluation (Signed)
Anesthesia Evaluation  Patient identified by MRN, date of birth, ID band Patient awake    Reviewed: Allergy & Precautions, H&P , NPO status , Patient's Chart, lab work & pertinent test results, reviewed documented beta blocker date and time   History of Anesthesia Complications Negative for: history of anesthetic complications  Airway Mallampati: I  TM Distance: >3 FB Neck ROM: full    Dental  (+) Dental Advidsory Given, Caps, Missing, Teeth Intact   Pulmonary neg pulmonary ROS,    Pulmonary exam normal        Cardiovascular Exercise Tolerance: Good hypertension, (-) angina(-) Past MI and (-) Cardiac Stents Normal cardiovascular exam(-) dysrhythmias (-) Valvular Problems/Murmurs     Neuro/Psych negative neurological ROS  negative psych ROS   GI/Hepatic Neg liver ROS, GERD  ,  Endo/Other  negative endocrine ROS  Renal/GU negative Renal ROS  negative genitourinary   Musculoskeletal   Abdominal   Peds  Hematology negative hematology ROS (+)   Anesthesia Other Findings Past Medical History: No date: Cancer ([place]) No date: GERD (gastroesophageal reflux disease) No date: Hypertension   Reproductive/Obstetrics negative OB ROS                             Anesthesia Physical Anesthesia Plan  ASA: II  Anesthesia Plan: Spinal   Post-op Pain Management:    Induction:   PONV Risk Score and Plan: Propofol infusion and TIVA  Airway Management Planned: Natural Airway and Simple Face Mask  Additional Equipment:   Intra-op Plan:   Post-operative Plan:   Informed Consent: I have reviewed the patients History and Physical, chart, labs and discussed the procedure including the risks, benefits and alternatives for the proposed anesthesia with the patient or authorized representative who has indicated his/her understanding and acceptance.     Dental Advisory Given  Plan Discussed with:  Anesthesiologist, CRNA and Surgeon  Anesthesia Plan Comments:         Anesthesia Quick Evaluation

## 2019-08-06 NOTE — Transfer of Care (Signed)
Immediate Anesthesia Transfer of Care Note  Patient: Lee Hutchinson  Procedure(s) Performed: TOTAL KNEE ARTHROPLASTY (Left Knee)  Patient Location: PACU  Anesthesia Type:Spinal  Level of Consciousness: awake, alert  and oriented  Airway & Oxygen Therapy: Patient Spontanous Breathing and Patient connected to nasal cannula oxygen  Post-op Assessment: Report given to RN and Post -op Vital signs reviewed and stable  Post vital signs: Reviewed and stable  Last Vitals:  Vitals Value Taken Time  BP    Temp    Pulse 83 08/06/19 0930  Resp 14 08/06/19 0930  SpO2 98 % 08/06/19 0930  Vitals shown include unvalidated device data.  Last Pain:  Vitals:   08/06/19 0620  TempSrc: Tympanic  PainSc: 0-No pain         Complications: No apparent anesthesia complications

## 2019-08-06 NOTE — Progress Notes (Signed)
Applied polar care

## 2019-08-06 NOTE — Op Note (Signed)
08/06/2019  9:48 AM  PATIENT:  Lee Hutchinson  74 y.o. male  PRE-OPERATIVE DIAGNOSIS:  PRIMARY OSTEOARTHRITIS OF LEFT KNEE  POST-OPERATIVE DIAGNOSIS:  PRIMARY OSTEOARTHRITIS OF LEFT KNEE  PROCEDURE:  Procedure(s): TOTAL KNEE ARTHROPLASTY (Left)  SURGEON: Laurene Footman, MD  ASSISTANTS: Rachelle Hora, PA-C  ANESTHESIA:   spinal  EBL:  Total I/O In: 1000 [I.V.:1000] Out: 325 [Urine:300; Blood:25]  BLOOD ADMINISTERED:none  DRAINS: none   LOCAL MEDICATIONS USED:  MARCAINE    and OTHER Exparel and morphine  SPECIMEN:  No Specimen  DISPOSITION OF SPECIMEN:  N/A  COUNTS:  YES  TOURNIQUET: 43 minutes at 300 mmHg  IMPLANTS: Medacta GM K sphere system, 4 left femur, 4 left tibia with 10 mm insert, 2 patella, short stem on tibia component and all components cemented  DICTATION: .Dragon Dictation  patient was brought to the operating room and after spinal anesthesiawas obtained, the left eg was prepped and draped in the usual sterile fashion. After patient identification and timeout procedures were completed, midline skin incision was made followed by medial parapatellar arthrotomywith bone-on-bone medial compartment osteoarthritis advanced patellofemoral arthritis and moderate lateral compartment arthritis,partial synovectomy was also carried out. The ACL and fat pad were excised off the anterior into the meniscus in the proximal tibia cutting guide from the [facility] was appliedand theproximal tibia cut carried out. The distal femoral cut was carried out in a similar fashion.  The4 femoralcutting guide applied with anterior posterior and chamfer cuts made. The posterior horns of the menisci were removed at this point.  Injection of the above medication was carried out after the femoral and tibial cuts were carried out. The4tibia baseplate trial was placed pinned into position and proximal tibial preparation carried out with drilling hand reaming and the keel punch  followed by placement of the7femur and sizing the tibial insert size23millimetergave the best fit with stability and full extension. The distal femoral drill holes were made in the notch cut for the trochlear groove was then carried out with trials were then removed the patella was cut using the patellar cutting guide and it sized to a size2afterdrill holes have been made The tourniquet was raised at this time.  The knee was irrigated with pulsatile lavage and the bony surfaces dried the tibial component was cemented into place first. Excess cement was removed and the polyethylene insert placed with a torque screw placed with a torque screwdriver tightened. The distal femoral component was placed and the knee was held in extension as the patellar button was clamped into place. After the cement was set excess cement was removed and the knee was again irrigated thoroughly thoroughly irrigated. The tourniquet was let down and hemostasis checkedwithelectrocautery.The arthrotomy was repaired with#2 Ethibond along witha heavy Quill suture, followed by 3-0 V lock subcuticular closure,skin staples, Xeroform 4 x 4's ABD web roll and Ace wrap  PLAN OF CARE: Discharge to home after PACU  PATIENT DISPOSITION:  PACU - hemodynamically stable.

## 2019-08-06 NOTE — H&P (Signed)
Reviewed paper H+P, will be scanned into chart. No changes noted.  

## 2019-08-06 NOTE — Progress Notes (Signed)
Worked with physical therapy  To same day surgery area

## 2019-08-07 NOTE — Anesthesia Postprocedure Evaluation (Signed)
Anesthesia Post Note  Patient: Lee Hutchinson  Procedure(s) Performed: TOTAL KNEE ARTHROPLASTY (Left Knee)  Patient location during evaluation: PACU Anesthesia Type: Spinal Level of consciousness: oriented and awake and alert Pain management: pain level controlled Vital Signs Assessment: post-procedure vital signs reviewed and stable Respiratory status: spontaneous breathing, respiratory function stable and patient connected to nasal cannula oxygen Cardiovascular status: blood pressure returned to baseline and stable Postop Assessment: no headache, no backache and no apparent nausea or vomiting Anesthetic complications: no     Last Vitals:  Vitals:   08/06/19 1434 08/06/19 1508  BP: 134/79 (!) 143/81  Pulse: 75 77  Resp: 16 18  Temp:    SpO2: 98% 98%    Last Pain:  Vitals:   08/07/19 0817  TempSrc:   PainSc: 3                  Martha Clan

## 2020-05-04 ENCOUNTER — Other Ambulatory Visit: Payer: Self-pay | Admitting: Orthopedic Surgery

## 2020-06-04 ENCOUNTER — Other Ambulatory Visit: Payer: Self-pay

## 2020-06-04 ENCOUNTER — Encounter
Admission: RE | Admit: 2020-06-04 | Discharge: 2020-06-04 | Disposition: A | Discharge status: Home / Self Care | Payer: Medicare Other | Source: Ambulatory Visit | Attending: Orthopedic Surgery | Admitting: Orthopedic Surgery

## 2020-06-04 DIAGNOSIS — Z01818 Encounter for other preprocedural examination: Secondary | ICD-10-CM | POA: Insufficient documentation

## 2020-06-04 LAB — CBC WITH DIFFERENTIAL/PLATELET
Abs Immature Granulocytes: 0.04 10*3/uL (ref 0.00–0.07)
Basophils Absolute: 0.1 10*3/uL (ref 0.0–0.1)
Basophils Relative: 1 %
Eosinophils Absolute: 0.4 10*3/uL (ref 0.0–0.5)
Eosinophils Relative: 5 %
HCT: 43.9 % (ref 39.0–52.0)
Hemoglobin: 15.5 g/dL (ref 13.0–17.0)
Immature Granulocytes: 1 %
Lymphocytes Relative: 15 %
Lymphs Abs: 1.3 10*3/uL (ref 0.7–4.0)
MCH: 31.9 pg (ref 26.0–34.0)
MCHC: 35.3 g/dL (ref 30.0–36.0)
MCV: 90.3 fL (ref 80.0–100.0)
Monocytes Absolute: 0.7 10*3/uL (ref 0.1–1.0)
Monocytes Relative: 9 %
Neutro Abs: 6 10*3/uL (ref 1.7–7.7)
Neutrophils Relative %: 69 %
Platelets: 186 10*3/uL (ref 150–400)
RBC: 4.86 MIL/uL (ref 4.22–5.81)
RDW: 12.3 % (ref 11.5–15.5)
WBC: 8.5 10*3/uL (ref 4.0–10.5)
nRBC: 0 % (ref 0.0–0.2)

## 2020-06-04 LAB — URINALYSIS, ROUTINE W REFLEX MICROSCOPIC
Bilirubin Urine: NEGATIVE
Glucose, UA: NEGATIVE mg/dL
Hgb urine dipstick: NEGATIVE
Ketones, ur: NEGATIVE mg/dL
Leukocytes,Ua: NEGATIVE
Nitrite: NEGATIVE
Protein, ur: NEGATIVE mg/dL
Specific Gravity, Urine: 1.014 (ref 1.005–1.030)
pH: 7 (ref 5.0–8.0)

## 2020-06-04 LAB — COMPREHENSIVE METABOLIC PANEL
ALT: 21 U/L (ref 0–44)
AST: 23 U/L (ref 15–41)
Albumin: 4.6 g/dL (ref 3.5–5.0)
Alkaline Phosphatase: 48 U/L (ref 38–126)
Anion gap: 11 (ref 5–15)
BUN: 20 mg/dL (ref 8–23)
CO2: 26 mmol/L (ref 22–32)
Calcium: 9.8 mg/dL (ref 8.9–10.3)
Chloride: 101 mmol/L (ref 98–111)
Creatinine, Ser: 1.19 mg/dL (ref 0.61–1.24)
GFR, Estimated: >60 mL/min (ref >60)
Glucose, Bld: 95 mg/dL (ref 70–99)
Potassium: 4 mmol/L (ref 3.5–5.1)
Sodium: 138 mmol/L (ref 135–145)
Total Bilirubin: 1 mg/dL (ref 0.3–1.2)
Total Protein: 7.2 g/dL (ref 6.5–8.1)

## 2020-06-04 LAB — TYPE AND SCREEN
ABO/RH(D): A POS
Antibody Screen: NEGATIVE

## 2020-06-04 LAB — SURGICAL PCR SCREEN
MRSA, PCR: NEGATIVE
Staphylococcus aureus: NEGATIVE

## 2020-06-04 NOTE — Patient Instructions (Addendum)
Your procedure is scheduled on: Tues 11/16 Report to Registration desk in Medical mall then to Day Surgery. To find out your arrival time please call [phone] between 1PM - 3PM on Mon .11/15  Remember: Instructions that are not followed completely may result in serious medical risk,  up to and including death, or upon the discretion of your surgeon and anesthesiologist your  surgery may need to be rescheduled.     _X__ 1. Do not eat food after midnight the night before your procedure.                 No chewing gum or hard candies. You may drink clear liquids up to 2 hours                 before you are scheduled to arrive for your surgery- DO not drink clear                 liquids within 2 hours of the start of your surgery.                 Clear Liquids include:  water, apple juice without pulp, clear Gatorade, G2 or                  Gatorade Zero (avoid Red/Purple/Blue), Black Coffee or Tea (Do not add                 anything to coffee or tea). __x___2.   Complete the "Ensure Clear Pre-surgery Clear Carbohydrate Drink" provided to you, 2 hours before arrival.  __X__2.  On the morning of surgery brush your teeth with toothpaste and water, you                may rinse your mouth with mouthwash if you wish.  Do not swallow any toothpaste of mouthwash.     _X__ 3.  No Alcohol for 24 hours before or after surgery.   ___ 4.  Do Not Smoke or use e-cigarettes For 24 Hours Prior to Your Surgery.                 Do not use any chewable tobacco products for at least 6 hours prior to                 Surgery.  ___  5.  Do not use any recreational drugs (marijuana, cocaine, heroin, ecstasy, MDMA or other)                For at least one week prior to your surgery.  Combination of these drugs with anesthesia                May have life threatening results.  ____  6.  Bring all medications with you on the day of surgery if instructed.   _x___  7.  Notify your doctor  if there is any change in your medical condition      (cold, fever, infections).     Do not wear jewelry,  Do not wear lotions,  You may wear deodorant. Do not shave 48 hours prior to surgery. Men may shave face and neck. Do not bring valuables to the hospital.    [facility] is not responsible for any belongings or valuables.  Contacts, dentures or bridgework may not be worn into surgery. Leave your suitcase in the car. After surgery it may be brought to your room. For patients admitted to the hospital, discharge time is  determined by your treatment team.   Patients discharged the day of surgery will not be allowed to drive home.   Make arrangements for someone to be with you for the first 24 hours of your Same Day Discharge.    Please read over the following fact sheets that you were given:   Incentive spirometer  __x__ Take these medicines the morning of surgery with A SIP OF WATER:    1. acetaminophen (TYLENOL) 500 MG tablet if needed  2. famotidine (EQ ACID REDUCER MAX ST) 20 MG tablet  3.   4.  5.  6.  ____ Fleet Enema (as directed)   __x__ Use CHG Soap (or wipes) as directed  ____ Use Benzoyl Peroxide Gel as instructed  ____ Use inhalers on the day of surgery  ____ Stop metformin 2 days prior to surgery    ____ Take 1/2 of usual insulin dose the night before surgery. No insulin the morning          of surgery.   ____ Stop Coumadin/Plavix/aspirin on   _x___ Stop Anti-inflammatories no ibuprofen aleve or aspirin after 11/9    May take tylenol   ____ Stop supplements until after surgery.    ____ Bring C-Pap to the hospital.    If you have any questions regarding your pre-procedure instructions,  Please call Pre-admit Testing at [phone]   Senokot S at least twice a day

## 2020-06-12 ENCOUNTER — Other Ambulatory Visit
Admission: RE | Admit: 2020-06-12 | Discharge: 2020-06-12 | Disposition: A | Discharge status: Home / Self Care | Payer: Medicare Other | Source: Ambulatory Visit | Attending: Orthopedic Surgery | Admitting: Orthopedic Surgery

## 2020-06-12 ENCOUNTER — Other Ambulatory Visit: Payer: Self-pay

## 2020-06-12 DIAGNOSIS — Z20822 Contact with and (suspected) exposure to covid-19: Secondary | ICD-10-CM | POA: Insufficient documentation

## 2020-06-12 DIAGNOSIS — Z01812 Encounter for preprocedural laboratory examination: Secondary | ICD-10-CM | POA: Insufficient documentation

## 2020-06-12 LAB — SARS CORONAVIRUS 2 (TAT 6-24 HRS): SARS Coronavirus 2: NEGATIVE

## 2020-06-15 MED ORDER — CHLORHEXIDINE GLUCONATE 0.12 % MT SOLN: 15.0000 mL | Freq: Once | OROMUCOSAL | Status: AC

## 2020-06-15 MED ORDER — ORAL CARE MOUTH RINSE: 15.0000 mL | Freq: Once | OROMUCOSAL | Status: AC

## 2020-06-15 MED ORDER — LACTATED RINGERS IV SOLN: INTRAVENOUS | Status: DC

## 2020-06-15 MED ORDER — CEFAZOLIN SODIUM-DEXTROSE 2-4 GM/100ML-% IV SOLN
2.0000 g | INTRAVENOUS | Status: AC
  Administered 2020-06-16: 2 g via INTRAVENOUS

## 2020-06-16 ENCOUNTER — Encounter
Admission: RE | Disposition: A | Discharge status: Home / Self Care | Payer: Self-pay | Source: Home / Self Care | Attending: Orthopedic Surgery

## 2020-06-16 ENCOUNTER — Inpatient Hospital Stay: Payer: Medicare Other | Admitting: Anesthesiology

## 2020-06-16 ENCOUNTER — Encounter: Payer: Self-pay | Admitting: Orthopedic Surgery

## 2020-06-16 ENCOUNTER — Observation Stay
Admission: RE | Admit: 2020-06-16 | Discharge: 2020-06-17 | Disposition: A | Discharge status: Home / Self Care | Payer: Medicare Other | Attending: Orthopedic Surgery | Admitting: Orthopedic Surgery

## 2020-06-16 ENCOUNTER — Inpatient Hospital Stay: Payer: Medicare Other

## 2020-06-16 ENCOUNTER — Other Ambulatory Visit: Payer: Self-pay

## 2020-06-16 DIAGNOSIS — Z96652 Presence of left artificial knee joint: Secondary | ICD-10-CM | POA: Diagnosis not present

## 2020-06-16 DIAGNOSIS — I10 Essential (primary) hypertension: Secondary | ICD-10-CM | POA: Diagnosis not present

## 2020-06-16 DIAGNOSIS — G8918 Other acute postprocedural pain: Secondary | ICD-10-CM

## 2020-06-16 DIAGNOSIS — M25561 Pain in right knee: Secondary | ICD-10-CM | POA: Diagnosis present

## 2020-06-16 DIAGNOSIS — M1711 Unilateral primary osteoarthritis, right knee: Principal | ICD-10-CM | POA: Insufficient documentation

## 2020-06-16 DIAGNOSIS — Z79899 Other long term (current) drug therapy: Secondary | ICD-10-CM | POA: Diagnosis not present

## 2020-06-16 DIAGNOSIS — Z96651 Presence of right artificial knee joint: Secondary | ICD-10-CM

## 2020-06-16 HISTORY — PX: TOTAL KNEE ARTHROPLASTY: SHX125

## 2020-06-16 LAB — CBC
HCT: 39.6 % (ref 39.0–52.0)
Hemoglobin: 13.9 g/dL (ref 13.0–17.0)
MCH: 32.1 pg (ref 26.0–34.0)
MCHC: 35.1 g/dL (ref 30.0–36.0)
MCV: 91.5 fL (ref 80.0–100.0)
Platelets: 162 10*3/uL (ref 150–400)
RBC: 4.33 MIL/uL (ref 4.22–5.81)
RDW: 12.3 % (ref 11.5–15.5)
WBC: 11.5 10*3/uL — ABNORMAL HIGH (ref 4.0–10.5)
nRBC: 0 % (ref 0.0–0.2)

## 2020-06-16 LAB — CREATININE, SERUM
Creatinine, Ser: 1.15 mg/dL (ref 0.61–1.24)
GFR, Estimated: >60 mL/min (ref >60)

## 2020-06-16 SURGERY — ARTHROPLASTY, KNEE, TOTAL
Anesthesia: Spinal | Site: Knee | Laterality: Right

## 2020-06-16 MED ORDER — MENTHOL 3 MG MT LOZG
1.0000 | LOZENGE | OROMUCOSAL | Status: DC | PRN
  Filled 2020-06-16: qty 9

## 2020-06-16 MED ORDER — EPHEDRINE 5 MG/ML INJ
INTRAVENOUS | Status: AC
  Filled 2020-06-16: qty 10

## 2020-06-16 MED ORDER — OXYCODONE HCL 5 MG/5ML PO SOLN: 5.0000 mg | Freq: Once | ORAL | Status: DC | PRN

## 2020-06-16 MED ORDER — FAMOTIDINE 20 MG PO TABS
20.0000 mg | ORAL_TABLET | Freq: Two times a day (BID) | ORAL | Status: DC
  Administered 2020-06-16 – 2020-06-17 (×2): 20 mg via ORAL
  Filled 2020-06-16 (×2): qty 1

## 2020-06-16 MED ORDER — MAGNESIUM HYDROXIDE 400 MG/5ML PO SUSP
30.0000 mL | Freq: Every day | ORAL | Status: DC | PRN
  Administered 2020-06-16: 30 mL via ORAL
  Filled 2020-06-16: qty 30

## 2020-06-16 MED ORDER — BUPIVACAINE-EPINEPHRINE (PF) 0.25% -1:200000 IJ SOLN
INTRAMUSCULAR | Status: AC
  Filled 2020-06-16: qty 30

## 2020-06-16 MED ORDER — METHOCARBAMOL 1000 MG/10ML IJ SOLN
500.0000 mg | Freq: Four times a day (QID) | INTRAVENOUS | Status: DC | PRN
  Filled 2020-06-16: qty 5

## 2020-06-16 MED ORDER — ZOLPIDEM TARTRATE 5 MG PO TABS: 5.0000 mg | ORAL_TABLET | Freq: Every evening | ORAL | Status: DC | PRN

## 2020-06-16 MED ORDER — ALUM & MAG HYDROXIDE-SIMETH 200-200-20 MG/5ML PO SUSP: 30.0000 mL | ORAL | Status: DC | PRN

## 2020-06-16 MED ORDER — BUPIVACAINE LIPOSOME 1.3 % IJ SUSP
INTRAMUSCULAR | Status: AC
  Filled 2020-06-16: qty 20

## 2020-06-16 MED ORDER — ATORVASTATIN CALCIUM 10 MG PO TABS
10.0000 mg | ORAL_TABLET | Freq: Every day | ORAL | Status: DC
  Administered 2020-06-16: 10 mg via ORAL
  Filled 2020-06-16: qty 1

## 2020-06-16 MED ORDER — METOCLOPRAMIDE HCL 10 MG PO TABS: 5.0000 mg | ORAL_TABLET | Freq: Three times a day (TID) | ORAL | Status: DC | PRN

## 2020-06-16 MED ORDER — METHOCARBAMOL 500 MG PO TABS
500.0000 mg | ORAL_TABLET | Freq: Four times a day (QID) | ORAL | Status: DC | PRN
  Administered 2020-06-17: 500 mg via ORAL
  Filled 2020-06-16: qty 1

## 2020-06-16 MED ORDER — MORPHINE SULFATE (PF) 10 MG/ML IV SOLN
INTRAVENOUS | Status: AC
  Filled 2020-06-16: qty 1

## 2020-06-16 MED ORDER — ENOXAPARIN SODIUM 30 MG/0.3ML ~~LOC~~ SOLN
30.0000 mg | Freq: Two times a day (BID) | SUBCUTANEOUS | Status: DC
  Administered 2020-06-17: 30 mg via SUBCUTANEOUS
  Filled 2020-06-16: qty 0.3

## 2020-06-16 MED ORDER — OXYCODONE HCL 5 MG PO TABS: 5.0000 mg | ORAL_TABLET | Freq: Once | ORAL | Status: DC | PRN

## 2020-06-16 MED ORDER — PROPOFOL 500 MG/50ML IV EMUL
INTRAVENOUS | Status: DC | PRN
  Administered 2020-06-16: 50 ug/kg/min via INTRAVENOUS

## 2020-06-16 MED ORDER — DIPHENHYDRAMINE HCL 12.5 MG/5ML PO ELIX: 12.5000 mg | ORAL_SOLUTION | ORAL | Status: DC | PRN

## 2020-06-16 MED ORDER — DEXAMETHASONE SODIUM PHOSPHATE 10 MG/ML IJ SOLN
INTRAMUSCULAR | Status: AC
  Filled 2020-06-16: qty 1

## 2020-06-16 MED ORDER — ONDANSETRON HCL 4 MG/2ML IJ SOLN
INTRAMUSCULAR | Status: AC
  Filled 2020-06-16: qty 2

## 2020-06-16 MED ORDER — DEXAMETHASONE SODIUM PHOSPHATE 10 MG/ML IJ SOLN
INTRAMUSCULAR | Status: DC | PRN
  Administered 2020-06-16: 10 mg via INTRAVENOUS

## 2020-06-16 MED ORDER — FENTANYL CITRATE (PF) 100 MCG/2ML IJ SOLN: 25.0000 ug | INTRAMUSCULAR | Status: DC | PRN

## 2020-06-16 MED ORDER — TRAMADOL HCL 50 MG PO TABS
50.0000 mg | ORAL_TABLET | Freq: Four times a day (QID) | ORAL | Status: DC
  Administered 2020-06-16 – 2020-06-17 (×5): 50 mg via ORAL
  Filled 2020-06-16 (×5): qty 1

## 2020-06-16 MED ORDER — SODIUM CHLORIDE 0.9 % IV SOLN
INTRAVENOUS | Status: DC | PRN
  Administered 2020-06-16: 60 mL

## 2020-06-16 MED ORDER — ONDANSETRON HCL 4 MG/2ML IJ SOLN: 4.0000 mg | Freq: Four times a day (QID) | INTRAMUSCULAR | Status: DC | PRN

## 2020-06-16 MED ORDER — ACETAMINOPHEN 325 MG PO TABS: 325.0000 mg | ORAL_TABLET | Freq: Four times a day (QID) | ORAL | Status: DC | PRN

## 2020-06-16 MED ORDER — PHENYLEPHRINE HCL (PRESSORS) 10 MG/ML IV SOLN
INTRAVENOUS | Status: DC | PRN
  Administered 2020-06-16 (×4): 100 ug via INTRAVENOUS

## 2020-06-16 MED ORDER — PROPOFOL 10 MG/ML IV BOLUS
INTRAVENOUS | Status: DC | PRN
  Administered 2020-06-16: 50 mg via INTRAVENOUS

## 2020-06-16 MED ORDER — BUPIVACAINE HCL (PF) 0.5 % IJ SOLN
INTRAMUSCULAR | Status: DC | PRN
  Administered 2020-06-16: 2.5 mL

## 2020-06-16 MED ORDER — CEFAZOLIN SODIUM-DEXTROSE 2-4 GM/100ML-% IV SOLN
INTRAVENOUS | Status: AC
  Filled 2020-06-16: qty 100

## 2020-06-16 MED ORDER — MORPHINE SULFATE (PF) 2 MG/ML IV SOLN: 0.5000 mg | INTRAVENOUS | Status: DC | PRN

## 2020-06-16 MED ORDER — HYDROCODONE-ACETAMINOPHEN 7.5-325 MG PO TABS: 1.0000 | ORAL_TABLET | ORAL | Status: DC | PRN

## 2020-06-16 MED ORDER — BISACODYL 10 MG RE SUPP: 10.0000 mg | Freq: Every day | RECTAL | Status: DC | PRN

## 2020-06-16 MED ORDER — PHENYLEPHRINE HCL (PRESSORS) 10 MG/ML IV SOLN
INTRAVENOUS | Status: AC
  Filled 2020-06-16: qty 1

## 2020-06-16 MED ORDER — BUPIVACAINE-EPINEPHRINE (PF) 0.25% -1:200000 IJ SOLN
INTRAMUSCULAR | Status: DC | PRN
  Administered 2020-06-16: 30 mL

## 2020-06-16 MED ORDER — ACETAMINOPHEN 10 MG/ML IV SOLN
INTRAVENOUS | Status: AC
  Filled 2020-06-16: qty 100

## 2020-06-16 MED ORDER — NEOMYCIN-POLYMYXIN B GU 40-200000 IR SOLN
Status: AC
  Filled 2020-06-16: qty 2

## 2020-06-16 MED ORDER — PROPOFOL 500 MG/50ML IV EMUL
INTRAVENOUS | Status: AC
  Filled 2020-06-16: qty 50

## 2020-06-16 MED ORDER — AMLODIPINE BESYLATE 10 MG PO TABS
10.0000 mg | ORAL_TABLET | Freq: Every day | ORAL | Status: DC
  Administered 2020-06-16: 10 mg via ORAL
  Filled 2020-06-16: qty 1

## 2020-06-16 MED ORDER — SODIUM CHLORIDE FLUSH 0.9 % IV SOLN
INTRAVENOUS | Status: AC
  Filled 2020-06-16: qty 40

## 2020-06-16 MED ORDER — EPHEDRINE SULFATE 50 MG/ML IJ SOLN
INTRAMUSCULAR | Status: DC | PRN
  Administered 2020-06-16 (×2): 5 mg via INTRAVENOUS

## 2020-06-16 MED ORDER — METOCLOPRAMIDE HCL 5 MG/ML IJ SOLN: 5.0000 mg | Freq: Three times a day (TID) | INTRAMUSCULAR | Status: DC | PRN

## 2020-06-16 MED ORDER — ACETAMINOPHEN 10 MG/ML IV SOLN
INTRAVENOUS | Status: DC | PRN
  Administered 2020-06-16: 1000 mg via INTRAVENOUS

## 2020-06-16 MED ORDER — MIDAZOLAM HCL 2 MG/2ML IJ SOLN
INTRAMUSCULAR | Status: AC
  Filled 2020-06-16: qty 2

## 2020-06-16 MED ORDER — ONDANSETRON HCL 4 MG/2ML IJ SOLN: 4.0000 mg | Freq: Once | INTRAMUSCULAR | Status: DC | PRN

## 2020-06-16 MED ORDER — CEFAZOLIN SODIUM-DEXTROSE 2-4 GM/100ML-% IV SOLN
2.0000 g | Freq: Four times a day (QID) | INTRAVENOUS | Status: AC
  Administered 2020-06-16 (×2): 2 g via INTRAVENOUS
  Filled 2020-06-16 (×2): qty 100

## 2020-06-16 MED ORDER — MORPHINE SULFATE (PF) 10 MG/ML IV SOLN
INTRAVENOUS | Status: DC | PRN
  Administered 2020-06-16: 1 mL

## 2020-06-16 MED ORDER — HYDROCODONE-ACETAMINOPHEN 5-325 MG PO TABS
1.0000 | ORAL_TABLET | ORAL | Status: DC | PRN
  Administered 2020-06-16: 2 via ORAL
  Administered 2020-06-16 – 2020-06-17 (×2): 1 via ORAL
  Administered 2020-06-17: 2 via ORAL
  Filled 2020-06-16 (×2): qty 2
  Filled 2020-06-16 (×2): qty 1

## 2020-06-16 MED ORDER — SODIUM CHLORIDE 0.9 % IV SOLN
INTRAVENOUS | Status: DC | PRN
  Administered 2020-06-16: 15 ug/min via INTRAVENOUS

## 2020-06-16 MED ORDER — SODIUM CHLORIDE 0.9 % IV SOLN: INTRAVENOUS | Status: DC

## 2020-06-16 MED ORDER — CHLORHEXIDINE GLUCONATE 0.12 % MT SOLN
OROMUCOSAL | Status: AC
  Administered 2020-06-16: 15 mL via OROMUCOSAL
  Filled 2020-06-16: qty 15

## 2020-06-16 MED ORDER — MIDAZOLAM HCL 5 MG/5ML IJ SOLN
INTRAMUSCULAR | Status: DC | PRN
  Administered 2020-06-16: 2 mg via INTRAVENOUS

## 2020-06-16 MED ORDER — NEOMYCIN-POLYMYXIN B GU 40-200000 IR SOLN
Status: DC | PRN
  Administered 2020-06-16: 2 mL

## 2020-06-16 MED ORDER — ONDANSETRON HCL 4 MG/2ML IJ SOLN
INTRAMUSCULAR | Status: DC | PRN
  Administered 2020-06-16: 4 mg via INTRAVENOUS

## 2020-06-16 MED ORDER — DOCUSATE SODIUM 100 MG PO CAPS
100.0000 mg | ORAL_CAPSULE | Freq: Two times a day (BID) | ORAL | Status: DC
  Administered 2020-06-16 – 2020-06-17 (×3): 100 mg via ORAL
  Filled 2020-06-16 (×3): qty 1

## 2020-06-16 MED ORDER — ONDANSETRON HCL 4 MG PO TABS
4.0000 mg | ORAL_TABLET | Freq: Four times a day (QID) | ORAL | Status: DC | PRN
  Administered 2020-06-16: 4 mg via ORAL
  Filled 2020-06-16: qty 1

## 2020-06-16 MED ORDER — MAGNESIUM CITRATE PO SOLN
1.0000 | Freq: Once | ORAL | Status: DC | PRN
  Filled 2020-06-16: qty 296

## 2020-06-16 MED ORDER — PHENOL 1.4 % MT LIQD
1.0000 | OROMUCOSAL | Status: DC | PRN
  Filled 2020-06-16: qty 177

## 2020-06-16 SURGICAL SUPPLY — 71 items
BLADE SAGITTAL 25.0X1.19X90 (BLADE) ×2 IMPLANT
BLADE SAGITTAL 25.0X1.19X90MM (BLADE) ×1
BLADE SAGITTAL AGGR TOOTH XLG (BLADE) ×3 IMPLANT
BNDG ELASTIC 6X5.8 VLCR STR LF (GAUZE/BANDAGES/DRESSINGS) ×3 IMPLANT
CANISTER SUCT 1200ML W/VALVE (MISCELLANEOUS) ×3 IMPLANT
CANISTER SUCT 3000ML PPV (MISCELLANEOUS) ×6 IMPLANT
CANISTER WOUND CARE 500ML ATS (WOUND CARE) ×3 IMPLANT
CEMENT HV SMART SET (Cement) ×6 IMPLANT
CHLORAPREP W/TINT 26 (MISCELLANEOUS) ×6 IMPLANT
COOLER POLAR GLACIER W/PUMP (MISCELLANEOUS) ×3 IMPLANT
COVER WAND RF STERILE (DRAPES) ×3 IMPLANT
CUFF TOURN SGL QUICK 24 (TOURNIQUET CUFF)
CUFF TOURN SGL QUICK 30 (TOURNIQUET CUFF)
CUFF TRNQT CYL 24X4X16.5-23 (TOURNIQUET CUFF) IMPLANT
CUFF TRNQT CYL 30X4X21-28X (TOURNIQUET CUFF) IMPLANT
DRAPE 3/4 80X56 (DRAPES) ×6 IMPLANT
DRSG MEPILEX SACRM 8.7X9.8 (GAUZE/BANDAGES/DRESSINGS) ×3 IMPLANT
ELECT CAUTERY BLADE 6.4 (BLADE) ×3 IMPLANT
ELECT REM PT RETURN 9FT ADLT (ELECTROSURGICAL) ×3
ELECTRODE REM PT RTRN 9FT ADLT (ELECTROSURGICAL) ×1 IMPLANT
FEMORAL COMP SZ4 RIGHT SPHERE (Femur) ×3 IMPLANT
GAUZE SPONGE 4X4 12PLY STRL (GAUZE/BANDAGES/DRESSINGS) ×3 IMPLANT
GAUZE XEROFORM 1X8 LF (GAUZE/BANDAGES/DRESSINGS) ×3 IMPLANT
GLOVE BIOGEL PI IND STRL 9 (GLOVE) ×1 IMPLANT
GLOVE BIOGEL PI INDICATOR 9 (GLOVE) ×2
GLOVE INDICATOR 8.0 STRL GRN (GLOVE) ×3 IMPLANT
GLOVE SURG ORTHO 8.0 STRL STRW (GLOVE) ×3 IMPLANT
GLOVE SURG SYN 9.0  PF PI (GLOVE) ×2
GLOVE SURG SYN 9.0 PF PI (GLOVE) ×1 IMPLANT
GOWN SRG 2XL LVL 4 RGLN SLV (GOWNS) ×1 IMPLANT
GOWN STRL NON-REIN 2XL LVL4 (GOWNS) ×2
GOWN STRL REUS W/ TWL LRG LVL3 (GOWN DISPOSABLE) ×1 IMPLANT
GOWN STRL REUS W/ TWL XL LVL3 (GOWN DISPOSABLE) ×1 IMPLANT
GOWN STRL REUS W/TWL LRG LVL3 (GOWN DISPOSABLE) ×2
GOWN STRL REUS W/TWL XL LVL3 (GOWN DISPOSABLE) ×2
HOLDER FOLEY CATH W/STRAP (MISCELLANEOUS) ×3 IMPLANT
HOOD PEEL AWAY FLYTE STAYCOOL (MISCELLANEOUS) ×6 IMPLANT
INSERT TIBIAL SZ4 12MM RIGHT (Insert) ×3 IMPLANT
IRRIGATION SURGIPHOR STRL (IV SOLUTION) IMPLANT
KIT PREVENA INCISION MGT20CM45 (CANNISTER) ×3 IMPLANT
KIT TURNOVER KIT A (KITS) ×3 IMPLANT
MANIFOLD NEPTUNE II (INSTRUMENTS) ×3 IMPLANT
NDL SAFETY ECLIPSE 18X1.5 (NEEDLE) ×1 IMPLANT
NEEDLE HYPO 18GX1.5 SHARP (NEEDLE) ×2
NEEDLE SPNL 18GX3.5 QUINCKE PK (NEEDLE) ×3 IMPLANT
NEEDLE SPNL 20GX3.5 QUINCKE YW (NEEDLE) ×3 IMPLANT
NS IRRIG 1000ML POUR BTL (IV SOLUTION) ×3 IMPLANT
PACK TOTAL KNEE (MISCELLANEOUS) ×3 IMPLANT
PAD WRAPON POLAR KNEE (MISCELLANEOUS) ×1 IMPLANT
PATELLA RESURFACING MEDACTA 02 (Bone Implant) ×3 IMPLANT
PENCIL SMOKE EVACUATOR COATED (MISCELLANEOUS) ×3 IMPLANT
PULSAVAC PLUS IRRIG FAN TIP (DISPOSABLE) ×3
SCALPEL PROTECTED #10 DISP (BLADE) ×6 IMPLANT
SOL .9 NS 3000ML IRR  AL (IV SOLUTION) ×2
SOL .9 NS 3000ML IRR UROMATIC (IV SOLUTION) ×1 IMPLANT
STAPLER SKIN PROX 35W (STAPLE) ×3 IMPLANT
STEM EXTENSION 11MMX30MM (Stem) ×3 IMPLANT
SUCTION FRAZIER HANDLE 10FR (MISCELLANEOUS) ×2
SUCTION TUBE FRAZIER 10FR DISP (MISCELLANEOUS) ×1 IMPLANT
SUT DVC 2 QUILL PDO  T11 36X36 (SUTURE) ×2
SUT DVC 2 QUILL PDO T11 36X36 (SUTURE) ×1 IMPLANT
SUT ETHIBOND 2 V 37 (SUTURE) IMPLANT
SUT V-LOC 90 ABS DVC 3-0 CL (SUTURE) ×3 IMPLANT
SYR 20ML LL LF (SYRINGE) ×3 IMPLANT
SYR 50ML LL SCALE MARK (SYRINGE) ×6 IMPLANT
TIBIAL TRAY FIXED 4 MEDACTA 02 (Joint) ×3 IMPLANT
TIP FAN IRRIG PULSAVAC PLUS (DISPOSABLE) ×1 IMPLANT
TOWEL OR 17X26 4PK STRL BLUE (TOWEL DISPOSABLE) ×3 IMPLANT
TOWER CARTRIDGE SMART MIX (DISPOSABLE) ×3 IMPLANT
TRAY FOLEY MTR SLVR 16FR STAT (SET/KITS/TRAYS/PACK) ×3 IMPLANT
WRAPON POLAR PAD KNEE (MISCELLANEOUS) ×3

## 2020-06-16 NOTE — Transfer of Care (Signed)
Immediate Anesthesia Transfer of Care Note  Patient: Lee Hutchinson  Procedure(s) Performed: TOTAL KNEE ARTHROPLASTY (Right Knee)  Patient Location: PACU  Anesthesia Type:General  Level of Consciousness: awake, alert  and oriented  Airway & Oxygen Therapy: Patient Spontanous Breathing  Post-op Assessment: Post -op Vital signs reviewed and stable  Post vital signs: stable  Last Vitals:  Vitals Value Taken Time  BP 102/53 06/16/20 0928  Temp    Pulse 80 06/16/20 0928  Resp 15 06/16/20 0928  SpO2 100 % 06/16/20 0928  Vitals shown include unvalidated device data.  Last Pain:  Vitals:   06/16/20 0626  TempSrc: Tympanic  PainSc: 0-No pain         Complications: No complications documented.

## 2020-06-16 NOTE — Evaluation (Signed)
Occupational Therapy Evaluation Patient Details Name: Lee Hutchinson MRN: [id number] DOB: July 11, 1946 Today's Date: 06/16/2020    History of Present Illness Pt is a 74 yo male s/p R TKA, WBAT. PMH of HTN, L TKA in 1/21.   Clinical Impression   Pt seen for OT evaluation this date, POD#0 from above surgery. Pt was independent in all ADLs prior to surgery, works on a farm, and is eager to return to PLOF with less pain and improved safety and independence. Pt currently requires PRN minimal assist for LB dressing/bathing while in seated position due to pain and limited AROM of R knee. Pt/spouse instructed in polar care mgt, falls prevention strategies, home/routines modifications, DME/AE for LB bathing and dressing tasks, and compression stocking mgt. Handout provided to support recall and carryover. Pt/spouse verbalized understanding and declined additional concerns or questions for therapist. Do not currently anticipate any additional skilled OT needs at this time. Will sign off. Please re-consult if additional acute OT needs arise this hospitalization.      Follow Up Recommendations  No OT follow up    Equipment Recommendations  None recommended by OT    Recommendations for Other Services       Precautions / Restrictions Precautions Precautions: Fall;Knee Precaution Booklet Issued: Yes (comment) Restrictions Weight Bearing Restrictions: Yes RLE Weight Bearing: Weight bearing as tolerated      Mobility Bed Mobility Overal bed mobility: Modified Independent                  Transfers Overall transfer level: Needs assistance Equipment used: Rolling walker (2 wheeled) Transfers: Sit to/from Stand Sit to Stand: Supervision              Balance Overall balance assessment: Needs assistance Sitting-balance support: Feet supported Sitting balance-Leahy Scale: Good       Standing balance-Leahy Scale: Fair                             ADL either performed  or assessed with clinical judgement   ADL Overall ADL's : Needs assistance/impaired                                       General ADL Comments: PRN Min A for LB ADL; spouse present and both report spouse is able to assist as needed     Vision Baseline Vision/History: Wears glasses Wears Glasses: At all times Patient Visual Report: No change from baseline       Perception     Praxis      Pertinent Vitals/Pain Pain Assessment: No/denies pain     Hand Dominance     Extremity/Trunk Assessment Upper Extremity Assessment Upper Extremity Assessment: Overall WFL for tasks assessed   Lower Extremity Assessment Lower Extremity Assessment: RLE deficits/detail RLE Deficits / Details: s/p R TKA RLE Sensation: WNL LLE Deficits / Details: WFLs   Cervical / Trunk Assessment Cervical / Trunk Assessment: Normal   Communication Communication Communication: No difficulties   Cognition Arousal/Alertness: Awake/alert Behavior During Therapy: WFL for tasks assessed/performed Overall Cognitive Status: Within Functional Limits for tasks assessed                                     General Comments       Exercises Total Joint Exercises  Ankle Circles/Pumps: AROM;Strengthening;Both;10 reps Quad Sets: AROM;Strengthening;Right;10 reps Heel Slides: Strengthening;Right;10 reps;AROM Straight Leg Raises: AROM;Strengthening;Right;5 reps Goniometric ROM: -2 - 93 degrees Other Exercises Other Exercises: Pt/spouse instructed in falls prevention, AE/DME, ADL skills, compression stocking mgt, and polar care mgt; handout provided, both verbalize understanding   Shoulder Instructions      Home Living Family/patient expects to be discharged to:: Private residence Living Arrangements: Spouse/significant other Available Help at Discharge: Available 24 hours/day;Family Type of Home: House Home Access: Stairs to enter [employer] of Steps: 1 Entrance  Stairs-Rails: None Home Layout: One level     Bathroom Shower/Tub: Teacher, early years/pre: Standard     Home Equipment: Environmental consultant - 2 wheels;Shower seat;Bedside commode          Prior Functioning/Environment Level of Independence: Independent        Comments: 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        OT Problem List: Decreased strength;Decreased range of motion      OT Treatment/Interventions:      OT Goals(Current goals can be found in the care plan section) Acute Rehab OT Goals Patient Stated Goal: to go home OT Goal Formulation: All assessment and education complete, DC therapy  OT Frequency:     Barriers to D/C:            Co-evaluation              AM-PAC OT "6 Clicks" Daily Activity     Outcome Measure Help from another person eating meals?: None Help from another person taking care of personal grooming?: None Help from another person toileting, which includes using toliet, bedpan, or urinal?: None Help from another person bathing (including washing, rinsing, drying)?: A Little Help from another person to put on and taking off regular upper body clothing?: None Help from another person to put on and taking off regular lower body clothing?: A Little 6 Click Score: 22   End of Session    Activity Tolerance: Patient tolerated treatment well Patient left: in chair;with call bell/phone within reach;with chair alarm set;with SCD's reapplied;Other (comment);with family/visitor present (polar care in place)  OT Visit Diagnosis: Other abnormalities of gait and mobility (R26.89)                Time: 1450-1505 OT Time Calculation (min): 15 min Charges:  OT General Charges $OT Visit: 1 Visit OT Evaluation $OT Eval Low Complexity: 1 Low OT Treatments $Self Care/Home Management : 8-22 mins  Jeni Salles, MPH, MS, OTR/L ascom [phone] 06/16/20, 3:49 PM

## 2020-06-16 NOTE — Anesthesia Procedure Notes (Signed)
Spinal  Patient location during procedure: OR Start time: 06/16/2020 7:20 AM End time: 06/16/2020 7:31 AM Staffing Performed: anesthesiologist  Anesthesiologist: Tera Mater, MD Resident/CRNA: Aline Brochure, CRNA Preanesthetic Checklist Completed: patient identified, IV checked, site marked, risks and benefits discussed, surgical consent, monitors and equipment checked, pre-op evaluation and timeout performed Spinal Block Patient position: sitting Prep: ChloraPrep Patient monitoring: heart rate, continuous pulse ox, blood pressure and cardiac monitor Approach: midline Location: L3-4 Injection technique: single-shot Needle Needle type: Introducer and Pencan  Needle gauge: 25 G Needle length: 9 cm Assessment Sensory level: T10 Additional Notes Negative paresthesia. Negative blood return. Positive free-flowing CSF. Expiration date of kit checked and confirmed. Patient tolerated procedure well, without complications.

## 2020-06-16 NOTE — Op Note (Signed)
06/16/2020  9:26 AM  PATIENT:  Lee Hutchinson  74 y.o. male  PRE-OPERATIVE DIAGNOSIS:  Primary osteoarthritis of right knee M17.11  POST-OPERATIVE DIAGNOSIS:  Primary osteoarthritis of right knee M17.11  PROCEDURE:  Procedure(s): TOTAL KNEE ARTHROPLASTY (Right)  SURGEON: Laurene Footman, MD  ASSISTANTS: Rachelle Hora, PA-C  ANESTHESIA:   spinal  EBL:  Total I/O In: 100 [IV Piggyback:100] Out: 350 [Urine:300; Blood:50]  BLOOD ADMINISTERED:none  DRAINS: none and Incisional wound VAC applied   LOCAL MEDICATIONS USED:  MARCAINE    and OTHER Exparel and morphine  SPECIMEN:  No Specimen  DISPOSITION OF SPECIMEN:  N/A  COUNTS:  YES  TOURNIQUET: 62 minutes at 300 mmHg  IMPLANTS: Medacta GM K knee system with 4 right sphere, 4 right tibia with short stem and 12 mm insert, 2 patella, all components cemented  DICTATION: .Dragon Dictation  patient was brought to the operating room and afterspinal anesthesiawas obtained,the  right leg was prepped and draped in the usual sterile fashion. After patient identification and timeout procedures were completed, midline skin incision was made followed by medial parapatellar arthrotomywith bone-on-bone medial compartment osteoarthritis advanced patellofemoral arthritis and moderate lateral compartment arthritis,partial synovectomy was also carried out. The ACL and fat pad were excised off the anterior into the meniscus in the proximal tibia cutting guide from the extra medullary system was appliedand theproximal tibia cut carried out. The distal femoral cut was carried out using intramedullary alignment.  The4 femoralcutting guide applied with anterior posterior and chamfer cuts made. The posterior horns of the menisci were removed at this point.  Injection of the above medication was carried out after the femoral and tibial cuts were carried out. The4tibia baseplate trial was placed pinned into position and proximal tibial  preparation carried out with drilling hand reaming and the keel punch followed by placement of the4femur and sizing the tibial insert size32millimetergave the best fit with stability and full extension. The distal femoral drill holes were made in the notch cut for the trochlear groove was then carried out with trials were then removed the patella was cut using the patellar cutting guide and it sized to a size2afterdrill holes have been made The tourniquet was raised at this time. The knee was irrigated with pulsatile lavage and the bony surfaces dried the tibial component was cemented into place first. Excess cement was removed and the polyethylene insert placed with a torque screw placed with a torque screwdriver tightened. The distal femoral component was placed and the knee was held in extension as the patellar button was clamped into place. After the cement was set excess cement was removed and the knee was again irrigated thoroughly thoroughly irrigated. The tourniquet was let down and hemostasis checkedwithelectrocautery.The arthrotomy was repaired with#2 Ethibond along witha heavy Quill suture, followed by 3-0 V lock subcuticular closure,skin staples, with incisional wound VAC applied  PLAN OF CARE: Admit to inpatient   PATIENT DISPOSITION:  PACU - hemodynamically stable.

## 2020-06-16 NOTE — H&P (Signed)
Chief Complaint  Patient presents with  . Right Knee - Pre-op Exam   History of the Present Illness: Lee Hutchinson is a 74 y.o. male here for history and physical for right total knee arthroplasty with Dr. Hessie Knows on 06/16/2020. Patient has severe degenerative changes in the right knee with complete loss of joint space in the medial compartment with varus deformity. He has tried conservative treatment consisting of oral anti-inflammatory medications, topical anti-inflammatory medications, injection therapy as well as bracing with only mild relief. Pain is interfering with quality of life and activities day living. Patient is a Psychologist, sport and exercise and very active. Risks, benefits, complications of a right total knee arthroplasty have been discussed with patient. Patient would like to proceed with right TKA. He has underwent successful left total knee arthroplasty back in January.  The patient had a left knee arthroplasty in 08/2019.   The patient is a Government social research officer.   I have reviewed past medical, surgical, social and family history, and allergies as documented in the EMR.  Past Medical History: Past Medical History:  Diagnosis Date  . Cancer prostate  . Diverticulitis  . Elevated PSA  . Hyperlipidemia  . Hypertension  . Pain of left thumb   Past Surgical History: Past Surgical History:  Procedure Laterality Date  . ABDOMINAL SURGERY  abd prostatectomy  . ADMITTED FOR DIVERTICULITIS  . HERNIA REPAIR  as child  . PROSTATECTOMY PERINEAL  . REPAIR INCISIONAL HERNIA LAPAROSCOPIC N/A 07/03/2013  Procedure: LAPAROSCOPIC REPAIR INCISIONAL HERNIA midline incisional hernia repair; Surgeon: Thayer Ohm., MD; Location: [facility]; Service: General Surgery; Laterality: N/A; midline incisional hernia repair  . SCREENING COLONOSCOPY N/A 04/18/2012  Procedure: SCREENING COLONOSCOPY; Surgeon: Dalene Carrow, MD; Location: [place]; Service: Gastroenterology; Laterality: N/A;  . TOTAL  KNEE ARTHROPLASTY (Left) Left 08/06/2019  Dr. Rudene Christians  . UMBILICAL HERNIA REPAIR  with 20 cm mesh   Past Family History: Family History  Problem Relation Age of Onset  . Alzheimer's disease Mother  . Lung cancer Father  . High blood pressure (Hypertension) Brother  . Colon polyps Brother  . Autoimmune disease Son  Ron Parker Syndrome  . High blood pressure (Hypertension) Maternal Grandmother  . Coronary Artery Disease (Blocked arteries around heart) Maternal Grandfather 75  . Dementia Paternal Grandfather  . Peripheral vascular disease Paternal Grandfather  . Anesthesia problems Neg Hx   Medications: Current Outpatient Medications Ordered in Epic  Medication Sig Dispense Refill  . acetaminophen (TYLENOL ORAL) Take 500 mg by mouth every morning  . amLODIPine (NORVASC) 10 MG tablet Take 1 tablet (10 mg total) by mouth once daily 90 tablet 3  . atorvastatin (LIPITOR) 10 MG tablet Take 1 tablet (10 mg total) by mouth once daily 90 tablet 3  . famotidine (PEPCID) 20 MG tablet Take 20 mg by mouth 2 (two) times daily.  . melatonin 5 mg Cap Take 1 capsule by mouth nightly  . papaverine-phentolamine-prostaglandin (TRI-MIX) 12mg -1mg -24mcg/mL injection Inject 1 mL into the corpus cavernosa once daily as needed for Erectile Dysfunction 5 mL 11   No current Epic-ordered facility-administered medications on file.   Allergies: No Known Allergies   Body mass index is 27.44 kg/m.  Review of Systems: A comprehensive 14 point ROS was performed, reviewed, and the pertinent orthopaedic findings are documented in the HPI.  Vitals:  06/04/20 1529  BP: 128/76   General Physical Examination:  General:  Well developed, well nourished, no apparent distress, normal affect, normal gait with no antalgic component.  HEENT: Head normocephalic, atraumatic, PERRL.   Abdomen: Soft, non tender, non distended, Bowel sounds present.  Heart: Examination of the heart reveals regular, rate, and rhythm.  There is no murmur noted on ascultation. There is a normal apical pulse.  Lungs: Lungs are clear to auscultation. There is no wheeze, rhonchi, or crackles. There is normal expansion of bilateral chest walls.   Musculoskeletal Examination: Right knee has range of motion of 5 to 115 degrees. Crepitus with rom. Slight varus deformity. No effusion. Patella tracks well in the trochlear groove. Extensor mechanism is intact. No swelling or edema throughout the lower extremity.  Radiographs: X-rays of the right knee reviewed by me from 01/29/2020 show complete loss of joint space in the medial compartment of the right knee with varus deformity and sclerotic changes. No evidence of acute bony abnormality or effusion.  Assessment: ICD-10-CM  1. Primary osteoarthritis of right knee M17.11   Plan: 1. Risks, benefits, complications of a right total knee arthroplasty have been discussed with the patient. Patient has agreed and consented to the procedure with Dr. Hessie Knows on 06/16/2020.    Electronically signed by Feliberto Gottron, [place] at 06/04/2020 3:41 PM EDT   Reviewed  H+P, No changes noted.

## 2020-06-16 NOTE — Evaluation (Signed)
Physical Therapy Evaluation Patient Details Name: Lee Hutchinson MRN: [id number] DOB: 05-06-1946 Today's Date: 06/16/2020   History of Present Illness  Pt is a 74 yo male s/p R TKA, WBAT. PMH of HTN.  Clinical Impression  Patient alert, oriented, agreeable to PT. The patient reported pain in his R knee as 3/10. Independent at home, had previous L TKA where he used a RW but has progressed to independent with all mobility, no falls in the last year.  The patient was able to perform supine exercises with verbal/tactile cueing. Supine to sit modI, but did complain of nausea that resolved with time. Sit <> stand with RW and CGA, and he was able to ambulate ~71ft with CGA/supervision. Mild antalgic gait noted with step through gait pattern. Pt returned to chair, all needs in reach.  Overall the patient demonstrated deficits (see "PT Problem List") that impede the patient's functional abilities, safety, and mobility and would benefit from skilled PT intervention. Recommendation is HHPT.     Follow Up Recommendations Home health PT;Follow surgeon's recommendation for DC plan and follow-up therapies    Equipment Recommendations  None recommended by PT    Recommendations for Other Services       Precautions / Restrictions Precautions Precautions: Fall;Knee Precaution Booklet Issued: Yes (comment) Restrictions Weight Bearing Restrictions: Yes RLE Weight Bearing: Weight bearing as tolerated      Mobility  Bed Mobility Overal bed mobility: Modified Independent                  Transfers Overall transfer level: Needs assistance Equipment used: Rolling walker (2 wheeled) Transfers: Sit to/from Stand Sit to Stand: Supervision            Ambulation/Gait Ambulation/Gait assistance: Min guard;Supervision Gait Distance (Feet): 80 Feet Assistive device: Rolling walker (2 wheeled)       General Gait Details: mild antalgic gait  on RLE, step through gait pattern  noted  Stairs            Wheelchair Mobility    Modified Rankin (Stroke Patients Only)       Balance Overall balance assessment: Needs assistance Sitting-balance support: Feet supported Sitting balance-Leahy Scale: Good       Standing balance-Leahy Scale: Fair                               Pertinent Vitals/Pain Pain Assessment: No/denies pain    Home Living Family/patient expects to be discharged to:: Private residence Living Arrangements: Spouse/significant other Available Help at Discharge: Available 24 hours/day;Family Type of Home: House Home Access: Stairs to enter Entrance Stairs-Rails: None Entrance Stairs-Number of Steps: 1 Home Layout: One level Home Equipment: Environmental consultant - 2 wheels;Shower seat;Bedside commode      Prior Function Level of Independence: Independent         Comments: 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, newspaper        Extremity/Trunk Assessment   Upper Extremity Assessment Upper Extremity Assessment: Overall WFL for tasks assessed    Lower Extremity Assessment Lower Extremity Assessment: RLE deficits/detail;LLE deficits/detail RLE Deficits / Details: s/p R TKA LLE Deficits / Details: WFLs    Cervical / Trunk Assessment Cervical / Trunk Assessment: Normal  Communication   Communication: No difficulties  Cognition Arousal/Alertness: Awake/alert Behavior During Therapy: WFL for tasks assessed/performed Overall Cognitive Status: Within Functional Limits for tasks assessed  General Comments      Exercises Total Joint Exercises Ankle Circles/Pumps: AROM;Strengthening;Both;10 reps Quad Sets: AROM;Strengthening;Right;10 reps Heel Slides: Strengthening;Right;10 reps;AROM Straight Leg Raises: AROM;Strengthening;Right;5 reps Goniometric ROM: -2 - 93 degrees   Assessment/Plan    PT Assessment Patient needs continued PT services  PT Problem List  Decreased strength;Decreased mobility;Decreased range of motion;Decreased activity tolerance;Decreased balance;Decreased knowledge of use of DME;Pain       PT Treatment Interventions DME instruction;Therapeutic exercise;Gait training;Balance training;Stair training;Neuromuscular re-education;Functional mobility training;Therapeutic activities;Patient/family education    PT Goals (Current goals can be found in the Care Plan section)  Acute Rehab PT Goals Patient Stated Goal: to go home PT Goal Formulation: With patient Time For Goal Achievement: 06/30/20 Potential to Achieve Goals: Good    Frequency BID   Barriers to discharge        Co-evaluation               AM-PAC PT "6 Clicks" Mobility  Outcome Measure Help needed turning from your back to your side while in a flat bed without using bedrails?: A Little Help needed moving from lying on your back to sitting on the side of a flat bed without using bedrails?: A Little Help needed moving to and from a bed to a chair (including a wheelchair)?: A Little Help needed standing up from a chair using your arms (e.g., wheelchair or bedside chair)?: A Little Help needed to walk in hospital room?: A Little Help needed climbing 3-5 steps with a railing? : A Little 6 Click Score: 18    End of Session Equipment Utilized During Treatment: Gait belt Activity Tolerance: Patient tolerated treatment well Patient left: with chair alarm set;in chair;with call bell/phone within reach;with SCD's reapplied Nurse Communication: Mobility status PT Visit Diagnosis: Other abnormalities of gait and mobility (R26.89);Muscle weakness (generalized) (M62.81);Difficulty in walking, not elsewhere classified (R26.2);Pain Pain - Right/Left: Right Pain - part of body: Knee    Time: 1027-2536 PT Time Calculation (min) (ACUTE ONLY): 38 min   Charges:   PT Evaluation $PT Eval Low Complexity: 1 Low PT Treatments $Therapeutic Exercise: 23-37 mins         Lieutenant Diego PT, DPT 3:43 PM,06/16/20

## 2020-06-16 NOTE — Anesthesia Preprocedure Evaluation (Signed)
Anesthesia Evaluation  Patient identified by MRN, date of birth, ID band Patient awake    Reviewed: Allergy & Precautions, H&P , NPO status , Patient's Chart, lab work & pertinent test results  History of Anesthesia Complications Negative for: history of anesthetic complications  Airway Mallampati: II  TM Distance: >3 FB     Dental  (+) Chipped   Pulmonary neg pulmonary ROS, neg sleep apnea, neg COPD,    breath sounds clear to auscultation       Cardiovascular hypertension, (-) angina(-) Past MI and (-) Cardiac Stents (-) dysrhythmias  Rhythm:regular Rate:Normal     Neuro/Psych negative neurological ROS  negative psych ROS   GI/Hepatic Neg liver ROS, GERD  Controlled,  Endo/Other  negative endocrine ROS  Renal/GU      Musculoskeletal   Abdominal   Peds  Hematology negative hematology ROS (+)   Anesthesia Other Findings Past Medical History: No date: 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 No date: GERD (gastroesophageal reflux disease) No date: Hypertension  Past Surgical History: No date: HERNIA REPAIR     Comment:  as a child 2013: ROBOT ASSISTED LAPAROSCOPIC RADICAL PROSTATECTOMY 08/06/2019: TOTAL KNEE ARTHROPLASTY; Left     Comment:  Procedure: TOTAL KNEE ARTHROPLASTY;  Surgeon: Hessie Knows, MD;  Location: [facility] ORS;  Service: Orthopedics;               Laterality: Left;  BMI    Body Mass Index: 27.40 kg/m      Reproductive/Obstetrics negative OB ROS                            Anesthesia Physical Anesthesia Plan  ASA: II  Anesthesia Plan: Spinal   Post-op Pain Management:    Induction:   PONV Risk Score and Plan: Propofol infusion and Ondansetron  Airway Management Planned: Simple Face Mask  Additional Equipment:   Intra-op Plan:   Post-operative Plan:   Informed Consent: I have reviewed the patients History and Physical, chart, labs and  discussed the procedure including the risks, benefits and alternatives for the proposed anesthesia with the patient or authorized representative who has indicated his/her understanding and acceptance.     Dental Advisory Given  Plan Discussed with: Anesthesiologist, CRNA and Surgeon  Anesthesia Plan Comments:        Anesthesia Quick Evaluation

## 2020-06-17 ENCOUNTER — Encounter: Payer: Self-pay | Admitting: Orthopedic Surgery

## 2020-06-17 DIAGNOSIS — M1711 Unilateral primary osteoarthritis, right knee: Secondary | ICD-10-CM | POA: Diagnosis not present

## 2020-06-17 LAB — CBC
HCT: 31.8 % — ABNORMAL LOW (ref 39.0–52.0)
Hemoglobin: 11 g/dL — ABNORMAL LOW (ref 13.0–17.0)
MCH: 31.8 pg (ref 26.0–34.0)
MCHC: 34.6 g/dL (ref 30.0–36.0)
MCV: 91.9 fL (ref 80.0–100.0)
Platelets: 155 10*3/uL (ref 150–400)
RBC: 3.46 MIL/uL — ABNORMAL LOW (ref 4.22–5.81)
RDW: 12.4 % (ref 11.5–15.5)
WBC: 13.1 10*3/uL — ABNORMAL HIGH (ref 4.0–10.5)
nRBC: 0 % (ref 0.0–0.2)

## 2020-06-17 LAB — BASIC METABOLIC PANEL
Anion gap: 8 (ref 5–15)
BUN: 20 mg/dL (ref 8–23)
CO2: 23 mmol/L (ref 22–32)
Calcium: 8.5 mg/dL — ABNORMAL LOW (ref 8.9–10.3)
Chloride: 104 mmol/L (ref 98–111)
Creatinine, Ser: 1.14 mg/dL (ref 0.61–1.24)
GFR, Estimated: >60 mL/min (ref >60)
Glucose, Bld: 138 mg/dL — ABNORMAL HIGH (ref 70–99)
Potassium: 4.6 mmol/L (ref 3.5–5.1)
Sodium: 135 mmol/L (ref 135–145)

## 2020-06-17 MED ORDER — CHLORHEXIDINE GLUCONATE CLOTH 2 % EX PADS: 6.0000 | MEDICATED_PAD | Freq: Every day | CUTANEOUS | Status: DC

## 2020-06-17 MED ORDER — TRAMADOL HCL 50 MG PO TABS: 50.0000 mg | ORAL_TABLET | Freq: Four times a day (QID) | ORAL | 0 refills | Status: DC

## 2020-06-17 MED ORDER — DOCUSATE SODIUM 100 MG PO CAPS: 100.0000 mg | ORAL_CAPSULE | Freq: Two times a day (BID) | ORAL | 0 refills | Status: DC

## 2020-06-17 MED ORDER — ENOXAPARIN SODIUM 40 MG/0.4ML ~~LOC~~ SOLN: 40.0000 mg | SUBCUTANEOUS | 0 refills | Status: DC

## 2020-06-17 MED ORDER — HYDROCODONE-ACETAMINOPHEN 5-325 MG PO TABS
1.0000 | ORAL_TABLET | ORAL | 0 refills | Status: DC | PRN
Start: 2020-06-17 — End: 2021-08-12

## 2020-06-17 NOTE — Progress Notes (Signed)
Dc instructions given to pt and wife scripts given asked md to send over to pharmacy pt taken out via w/c in apparent stable condition

## 2020-06-17 NOTE — Discharge Instructions (Signed)

## 2020-06-17 NOTE — Care Management Obs Status (Signed)
[place] NOTIFICATION   Patient Details  Name: Lee Hutchinson MRN: [id number] Date of Birth: 1946/08/01   Medicare Observation Status Notification Given:  Yes    Shelbie Ammons, RN 06/17/2020, 5:07 PM

## 2020-06-17 NOTE — Progress Notes (Signed)
   Subjective: 1 Day Post-Op Procedure(s) (LRB): TOTAL KNEE ARTHROPLASTY (Right) Patient reports pain as mild.   Patient is well, and has had no acute complaints or problems Denies any CP, SOB, ABD pain. We will continue therapy today.  Plan is to go Home after hospital stay.  Objective: Vital signs in last 24 hours: Temp:  [97.3 F (36.3 C)-98.4 F (36.9 C)] 98.4 F (36.9 C) (11/17 0721) Pulse Rate:  [61-87] 75 (11/17 0721) Resp:  [14-22] 16 (11/17 0721) BP: (102-138)/(53-80) 138/73 (11/17 0721) SpO2:  [94 %-100 %] 97 % (11/17 0721)  Intake/Output from previous day: 11/16 0701 - 11/17 0700 In: 1223.1 [P.O.:360; I.V.:663.1; IV Piggyback:200] Out: 2025 [Urine:1975; Blood:50] Intake/Output this shift: No intake/output data recorded.  Recent Labs    06/16/20 1110 06/17/20 0435  HGB 13.9 11.0*   Recent Labs    06/16/20 1110 06/17/20 0435  WBC 11.5* 13.1*  RBC 4.33 3.46*  HCT 39.6 31.8*  PLT 162 155   Recent Labs    06/16/20 1110 06/17/20 0435  NA  --  135  K  --  4.6  CL  --  104  CO2  --  23  BUN  --  20  CREATININE 1.15 1.14  GLUCOSE  --  138*  CALCIUM  --  8.5*   No results for input(s): LABPT, INR in the last 72 hours.  EXAM General - Patient is Alert, Appropriate and Oriented Extremity - Neurovascular intact Sensation intact distally Intact pulses distally Dorsiflexion/Plantar flexion intact No cellulitis present Compartment soft Dressing - dressing C/D/I and no drainage, prevena intact with out drainage Motor Function - intact, moving foot and toes well on exam.   Past Medical History:  Diagnosis Date  . 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 GERD (gastroesophageal reflux disease)   . Hypertension     Assessment/Plan:   1 Day Post-Op Procedure(s) (LRB): TOTAL KNEE ARTHROPLASTY (Right) Active Problems:   S/P TKR (total knee replacement) using cement, right  Estimated body mass index is 27.4 kg/m as calculated from the following:   Height  as of this encounter: 5\' 6"  (1.676 m).   Weight as of this encounter: 77 kg. Advance diet Up with therapy  Pain well controlled Work on BM VSS  Labs stable CM to assist with discharge to home with HHPT. Possible dc to home today if good progress with PT  DVT Prophylaxis - Lovenox, Foot Pumps and TED hose Weight-Bearing as tolerated to right leg   T. Rachelle Hora, PA-C [place] 06/17/2020, 7:55 AM

## 2020-06-17 NOTE — Progress Notes (Signed)
Physical Therapy Treatment Patient Details Name: Lee Hutchinson MRN: [id number] DOB: 14-Aug-1945 Today's Date: 06/17/2020    History of Present Illness Pt is a 73 yo male s/p R TKA, WBAT. PMH of HTN, L TKA in 1/21.    PT Comments    Pt alert, agreeable to therapy reported 2-3/10 R knee pain. The patient was able to perform several supine exercises with verbal cues, excellent form noted. Supine to sit modI, and pt performed sit <> stand with RW modI as well. He was easily able to ambulate ~175ft with RW and supervision. Cued for heel strike for RLE to maximize TKE in stance. He was able to perform stair navigation of one step with just verbal cueing to review prior to attempt. Pt up in chair all needs in reach, no further questions at this time. The patient would benefit from further skilled PT intervention to continue to progress towards goals. Recommendation remains appropriate.     Follow Up Recommendations  Home health PT;Follow surgeon's recommendation for DC plan and follow-up therapies     Equipment Recommendations  None recommended by PT    Recommendations for Other Services       Precautions / Restrictions Precautions Precautions: Fall;Knee Restrictions Weight Bearing Restrictions: Yes RLE Weight Bearing: Weight bearing as tolerated    Mobility  Bed Mobility Overal bed mobility: Modified Independent                Transfers Overall transfer level: Modified independent Equipment used: Rolling walker (2 wheeled) Transfers: Sit to/from Stand Sit to Stand: Modified independent (Device/Increase time)            Ambulation/Gait Ambulation/Gait assistance: Supervision Gait Distance (Feet): 180 Feet Assistive device: Rolling walker (2 wheeled)       General Gait Details: mild antalgic gait  on RLE, step through gait pattern noted   Stairs Stairs: Yes Stairs assistance: Min guard Stair Management: With walker;No rails Number of Stairs: 1      Wheelchair Mobility    Modified Rankin (Stroke Patients Only)       Balance Overall balance assessment: Needs assistance Sitting-balance support: Feet supported Sitting balance-Leahy Scale: Good     Standing balance support: Bilateral upper extremity supported Standing balance-Leahy Scale: Fair                              Cognition Arousal/Alertness: Awake/alert Behavior During Therapy: WFL for tasks assessed/performed Overall Cognitive Status: Within Functional Limits for tasks assessed                                        Exercises Total Joint Exercises Ankle Circles/Pumps: AROM;Strengthening;Both;15 reps Quad Sets: AROM;Strengthening;Right;15 reps Short Arc Quad: AROM;Strengthening;Right;15 reps Hip ABduction/ADduction: AROM;Strengthening;Right;15 reps Long Arc Quad: AROM;Strengthening;Right;15 reps Knee Flexion: AROM;Strengthening;Right;15 reps    General Comments        Pertinent Vitals/Pain Pain Assessment: 0-10 Pain Score: 3  Pain Location: R knee Pain Descriptors / Indicators: Sore Pain Intervention(s): Limited activity within patient's tolerance;Monitored during session;Premedicated before session;Repositioned;Ice applied    Home Living                      Prior Function            PT Goals (current goals can now be found in the care plan section)  Frequency    BID      PT Plan Current plan remains appropriate    Co-evaluation              AM-PAC PT "6 Clicks" Mobility   Outcome Measure  Help needed turning from your back to your side while in a flat bed without using bedrails?: None Help needed moving from lying on your back to sitting on the side of a flat bed without using bedrails?: None Help needed moving to and from a bed to a chair (including a wheelchair)?: None Help needed standing up from a chair using your arms (e.g., wheelchair or bedside chair)?: None Help needed to  walk in hospital room?: None Help needed climbing 3-5 steps with a railing? : None 6 Click Score: 24    End of Session Equipment Utilized During Treatment: Gait belt Activity Tolerance: Patient tolerated treatment well Patient left: in chair;with call bell/phone within reach;with SCD's reapplied Nurse Communication: Mobility status PT Visit Diagnosis: Other abnormalities of gait and mobility (R26.89);Muscle weakness (generalized) (M62.81);Difficulty in walking, not elsewhere classified (R26.2);Pain Pain - Right/Left: Right Pain - part of body: Knee     Time: 6333-5456 PT Time Calculation (min) (ACUTE ONLY): 28 min  Charges:  $Gait Training: 8-22 mins $Therapeutic Exercise: 8-22 mins                    Lieutenant Diego PT, DPT 1:51 PM,06/17/20

## 2020-06-17 NOTE — Anesthesia Postprocedure Evaluation (Signed)
Anesthesia Post Note  Patient: Lee Hutchinson  Procedure(s) Performed: TOTAL KNEE ARTHROPLASTY (Right Knee)  Patient location during evaluation: Nursing Unit Anesthesia Type: Spinal Level of consciousness: oriented and awake and alert Pain management: pain level controlled Vital Signs Assessment: post-procedure vital signs reviewed and stable Respiratory status: spontaneous breathing and respiratory function stable Cardiovascular status: blood pressure returned to baseline and stable Postop Assessment: no headache, no backache, no apparent nausea or vomiting and patient able to bend at knees Anesthetic complications: no   No complications documented.   Last Vitals:  Vitals:   06/17/20 0354 06/17/20 0721  BP: 121/60 138/73  Pulse: 87 75  Resp:  16  Temp: 36.7 C 36.9 C  SpO2: 97% 97%    Last Pain:  Vitals:   06/17/20 0800  TempSrc:   PainSc: 3                  Alison Stalling

## 2020-06-17 NOTE — Progress Notes (Signed)
Physical Therapy Treatment Patient Details Name: Lee Hutchinson MRN: [id number] DOB: [birth year] Today's Date: 06/17/2020    History of Present Illness Pt is a 74 yo male s/p R TKA, WBAT. PMH of HTN, L TKA in 1/21.    PT Comments    Pt alert, agreeable to PT, endorsed 3/10 R knee pain with ambulation. The patient demonstrated bed mobility and transfers modI, and ambulated ~379ft with RW and supervision. Pt cued for R heel strike, upright posture and to decreased weight bearing through RW as able with good carryover. Pt returned to bed with all needs in reach, bone foam in place. The patient would benefit from further skilled PT intervention to continue to progress towards goals. Recommendation remains appropriate.     Follow Up Recommendations  Home health PT;Follow surgeon's recommendation for DC plan and follow-up therapies     Equipment Recommendations  None recommended by PT    Recommendations for Other Services       Precautions / Restrictions Precautions Precautions: Fall;Knee Precaution Booklet Issued: Yes (comment) Restrictions Weight Bearing Restrictions: Yes RLE Weight Bearing: Weight bearing as tolerated    Mobility  Bed Mobility Overal bed mobility: Modified Independent                Transfers Overall transfer level: Modified independent Equipment used: Rolling walker (2 wheeled) Transfers: Sit to/from Stand Sit to Stand: Modified independent (Device/Increase time)            Ambulation/Gait Ambulation/Gait assistance: Modified independent (Device/Increase time) Gait Distance (Feet): 300 Feet Assistive device: Rolling walker (2 wheeled)       General Gait Details: mild antalgic gait  on RLE, step through gait pattern noted, cued for heel strike and upright posture   Stairs Stairs: Yes Stairs assistance: Min guard Stair Management: With walker;No rails Number of Stairs: 1     Wheelchair Mobility    Modified Rankin (Stroke Patients  Only)       Balance Overall balance assessment: Needs assistance Sitting-balance support: Feet supported Sitting balance-Leahy Scale: Good     Standing balance support: Bilateral upper extremity supported Standing balance-Leahy Scale: Good                              Cognition Arousal/Alertness: Awake/alert Behavior During Therapy: WFL for tasks assessed/performed Overall Cognitive Status: Within Functional Limits for tasks assessed                                        Exercises Total Joint Exercises Ankle Circles/Pumps: AROM;Strengthening;Both;15 reps Quad Sets: AROM;Strengthening;Right;15 reps Short Arc Quad: AROM;Strengthening;Right;15 reps Hip ABduction/ADduction: AROM;Strengthening;Right;15 reps Long Arc Quad: AROM;Strengthening;Right;15 reps;Seated Knee Flexion: AROM;Strengthening;Right;15 reps;Seated Goniometric ROM: -2 to 90 degrees    General Comments        Pertinent Vitals/Pain Pain Assessment: 0-10 Pain Score: 3  Pain Location: R knee Pain Descriptors / Indicators: Sore Pain Intervention(s): Limited activity within patient's tolerance;Monitored during session;Repositioned;Ice applied    Home Living                      Prior Function            PT Goals (current goals can now be found in the care plan section) Progress towards PT goals: Progressing toward goals    Frequency    BID  PT Plan Current plan remains appropriate    Co-evaluation              AM-PAC PT "6 Clicks" Mobility   Outcome Measure  Help needed turning from your back to your side while in a flat bed without using bedrails?: None Help needed moving from lying on your back to sitting on the side of a flat bed without using bedrails?: None Help needed moving to and from a bed to a chair (including a wheelchair)?: None Help needed standing up from a chair using your arms (e.g., wheelchair or bedside chair)?: None Help  needed to walk in hospital room?: None Help needed climbing 3-5 steps with a railing? : None 6 Click Score: 24    End of Session Equipment Utilized During Treatment: Gait belt Activity Tolerance: Patient tolerated treatment well Patient left: in chair;with call bell/phone within reach;with SCD's reapplied Nurse Communication: Mobility status PT Visit Diagnosis: Other abnormalities of gait and mobility (R26.89);Muscle weakness (generalized) (M62.81);Difficulty in walking, not elsewhere classified (R26.2);Pain Pain - Right/Left: Right Pain - part of body: Knee     Time: 9244-6286 PT Time Calculation (min) (ACUTE ONLY): 14 min  Charges:  $Gait Training: 8-22 mins $Therapeutic Exercise: 8-22 mins                     Lieutenant Diego PT, DPT 3:33 PM,06/17/20

## 2020-06-17 NOTE — Care Management CC44 (Signed)
Condition Code 44 Documentation Completed  Patient Details  Name: Lee Hutchinson MRN: [id number] Date of Birth: 08-30-1945   Condition Code 44 given:  Yes Patient signature on Condition Code 44 notice:  Yes Documentation of 2 MD's agreement:  Yes Code 44 added to claim:  Yes    Shelbie Ammons, RN 06/17/2020, 5:07 PM

## 2020-06-17 NOTE — TOC Initial Note (Signed)
Transition of Care [facility]) - Initial/Assessment Note    Patient Details  Name: Lee Hutchinson MRN: [id number] Date of Birth: [birth year]  Transition of Care [facility]) CM/SW Contact:    Shelbie Ammons, RN Phone Number: 06/17/2020, 10:46 AM  Clinical Narrative:   RNCM met with patient and wife in room. Patient sitting up in recliner, reports to feeling well today. He is hopeful to be going home today or tomorrow. Patient reports that he has already been contacted by [facility] and they are set up to come see him when he gets home. He reports that he already has all the equipment he needs due to having his other knee done earlier this year. No other needs identified at this time.  RNCM reached out to [place] with [facility] and they have patient ready.                 Expected Discharge Plan: [place] Barriers to Discharge: No Barriers Identified   Patient Goals and CMS Choice        Expected Discharge Plan and Services Expected Discharge Plan: [place] Choice: [place] arrangements for the past 2 months: Single Family Home                           HH Arranged: PT, OT [place] Agency: [facility] (formerly [employer]) Date [place]: 06/17/20 Time [place]: 40 Representative spoke with at [place]: [place] Arrangements/Services Living arrangements for the past 2 months: [place] Lives with:: Spouse Patient language and need for interpreter reviewed:: Yes Do you feel safe going back to the place where you live?: Yes      Need for Family Participation in Patient Care: Yes (Comment) Care giver support system in place?: Yes (comment)   Criminal Activity/Legal Involvement Pertinent to Current Situation/Hospitalization: No - Comment as needed  Activities of Daily Living Home Assistive Devices/Equipment: None ADL Screening (condition at time of admission) Patient's  cognitive ability adequate to safely complete daily activities?: Yes Is the patient deaf or have difficulty hearing?: No Does the patient have difficulty seeing, even when wearing glasses/contacts?: No Does the patient have difficulty concentrating, remembering, or making decisions?: No Patient able to express need for assistance with ADLs?: No Does the patient have difficulty dressing or bathing?: No Independently performs ADLs?: Yes (appropriate for developmental age) Does the patient have difficulty walking or climbing stairs?: No Weakness of Legs: None Weakness of Arms/Hands: None  Permission Sought/Granted                  Emotional Assessment Appearance:: Appears stated age Attitude/Demeanor/Rapport: Engaged Affect (typically observed): Calm, Appropriate Orientation: : Oriented to Self, Oriented to Place, Oriented to  Time, Oriented to Situation Alcohol / Substance Use: Not Applicable Psych Involvement: No (comment)  Admission diagnosis:  S/P TKR (total knee replacement) using cement, right [Z96.651] Patient Active Problem List   Diagnosis Date Noted  . S/P TKR (total knee replacement) using cement, right 06/16/2020   PCP:  Hortencia Pilar, MD Pharmacy:   [facility] [phone], [place] [address] Phone: [phone] Fax: [phone]     Social Determinants of Health (SDOH) Interventions    Readmission Risk Interventions No flowsheet data found.

## 2020-06-17 NOTE — Discharge Summary (Signed)
Physician Discharge Summary  Patient ID: Lee Hutchinson MRN: [id number] DOB/AGE: 1945/09/13 74 y.o.  Admit date: 06/16/2020 Discharge date: 06/17/2020  Admission Diagnoses:  S/P TKR (total knee replacement) using cement, right [Z96.651]   Discharge Diagnoses: Patient Active Problem List   Diagnosis Date Noted  . S/P TKR (total knee replacement) using cement, right 06/16/2020    Past Medical History:  Diagnosis Date  . 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 GERD (gastroesophageal reflux disease)   . Hypertension      Transfusion: none   Consultants (if any):   Discharged Condition: Improved  Hospital Course: Lee Hutchinson is an 74 y.o. male who was admitted 06/16/2020 with a diagnosis of right knee osteoarthritis and went to the operating room on 06/16/2020 and underwent the above named procedures.    Surgeries: Procedure(s): TOTAL KNEE ARTHROPLASTY on 06/16/2020 Patient tolerated the surgery well. Taken to PACU where she was stabilized and then transferred to the orthopedic floor.  Started on Lovenox 30 mg q 12 hrs. Foot pumps applied bilaterally at 80 mm. Heels elevated on bed with rolled towels. No evidence of DVT. Negative [place]. Physical therapy started on day #1 for gait training and transfer. OT started day #1 for ADL and assisted devices.  Patient's foley was d/c on day #1. Patient's IV was d/c on day #2.  On post op day #1 patient was stable and ready for discharge to home with HHPT.  Implants: Medacta GM K knee system with 4 right sphere, 4 right tibia with short stem and 12 mm insert, 2 patella, all components cemented  He was given perioperative antibiotics:  Anti-infectives (From admission, onward)   Start     Dose/Rate Route Frequency Ordered Stop   06/16/20 1330  ceFAZolin (ANCEF) IVPB 2g/100 mL premix        2 g 200 mL/hr over 30 Minutes Intravenous Every 6 hours 06/16/20 1032 06/16/20 2053   06/16/20 0621  ceFAZolin (ANCEF) 2-4 GM/100ML-% IVPB       Note to  Pharmacy: Myles Lipps   : cabinet override      06/16/20 0621 06/16/20 0750   06/16/20 0600  ceFAZolin (ANCEF) IVPB 2g/100 mL premix        2 g 200 mL/hr over 30 Minutes Intravenous On call to O.R. 06/15/20 2237 06/16/20 0756    .  He was given sequential compression devices, early ambulation, and Lovenox TEDs for DVT prophylaxis.  He benefited maximally from the hospital stay and there were no complications.    Recent vital signs:  Vitals:   06/17/20 1133 06/17/20 1532  BP: 122/76 140/78  Pulse: 78 75  Resp: 15 14  Temp: 97.9 F (36.6 C) 98.3 F (36.8 C)  SpO2: 97% 98%    Recent laboratory studies:  Lab Results  Component Value Date   HGB 11.0 (L) 06/17/2020   HGB 13.9 06/16/2020   HGB 15.5 06/04/2020   Lab Results  Component Value Date   WBC 13.1 (H) 06/17/2020   PLT 155 06/17/2020   No results found for: INR Lab Results  Component Value Date   NA 135 06/17/2020   K 4.6 06/17/2020   CL 104 06/17/2020   CO2 23 06/17/2020   BUN 20 06/17/2020   CREATININE 1.14 06/17/2020   GLUCOSE 138 (H) 06/17/2020    Discharge Medications:   Allergies as of 06/17/2020   No Known Allergies     Medication List    TAKE these medications  acetaminophen 500 MG tablet Commonly known as: TYLENOL Take 500 mg by mouth in the morning and at bedtime.   amLODipine 10 MG tablet Commonly known as: NORVASC Take 10 mg by mouth at bedtime.   atorvastatin 10 MG tablet Commonly known as: LIPITOR Take 10 mg by mouth at bedtime.   docusate sodium 100 MG capsule Commonly known as: COLACE Take 1 capsule (100 mg total) by mouth 2 (two) times daily.   enoxaparin 40 MG/0.4ML injection Commonly known as: Lovenox Inject 0.4 mLs (40 mg total) into the skin daily for 14 days.   EQ Acid Reducer Max St 20 MG tablet Generic drug: famotidine Take 20 mg by mouth 2 (two) times daily.   HYDROcodone-acetaminophen 5-325 MG tablet Commonly known as: NORCO/VICODIN Take 1-2 tablets by  mouth every 4 (four) hours as needed for moderate pain (pain score 4-6).   traMADol 50 MG tablet Commonly known as: ULTRAM Take 1 tablet (50 mg total) by mouth every 6 (six) hours.       Diagnostic Studies: DG Knee 1-2 Views Right  Result Date: 06/16/2020 CLINICAL DATA:  Total RIGHT replacement EXAM: RIGHT KNEE - 1-2 VIEW COMPARISON:  None. FINDINGS: Total knee arthroplasty. Prosthetic components appear in good position. Expected postsurgical soft tissue change. Skin staples noted. IMPRESSION: Total knee arthroplasty without complication Electronically Signed   By: Suzy Bouchard M.D.   On: 06/16/2020 09:59    Disposition:      Follow-up Information    Duanne Guess, PA-C Follow up in 2 week(s).   Specialties: Orthopedic Surgery, Emergency Medicine Contact information: [place] [address] [phone]                Signed: Feliberto Gottron 06/17/2020, 4:54 PM

## 2021-01-02 IMAGING — DX DG KNEE 1-2V*L*
2 series · 2 of 2 positions shown · non-contrast
Comparison: The CT on 05/30/2019

CLINICAL DATA: Post left total knee replacement

EXAM:
LEFT KNEE - 1-2 VIEW

[knee ap]
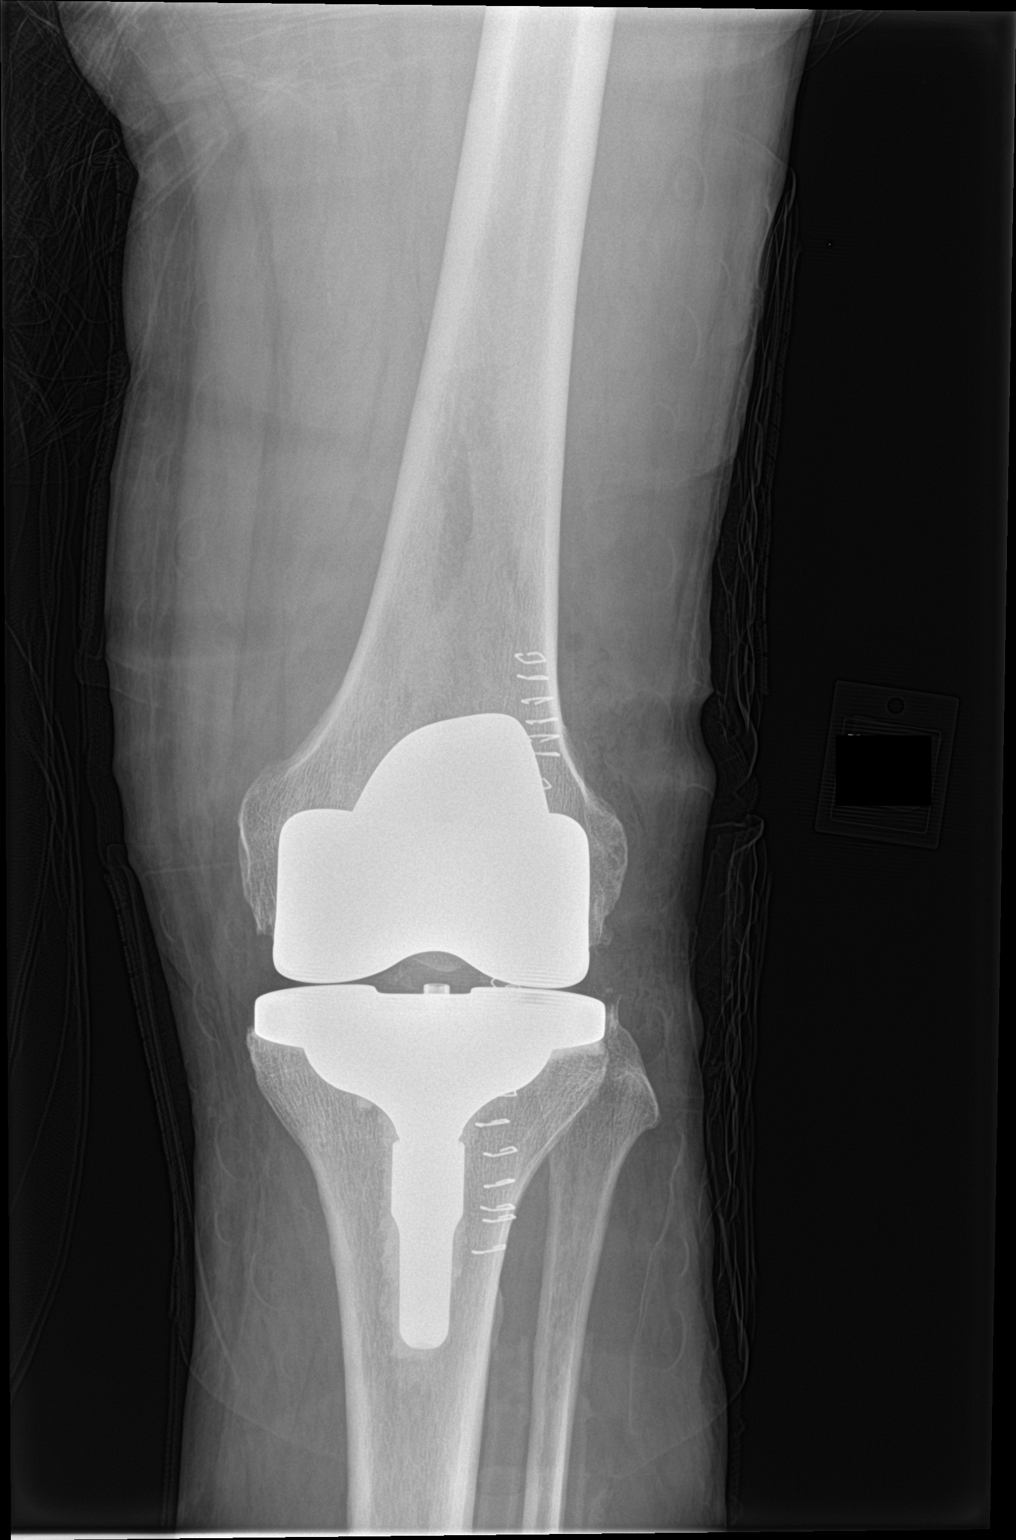

[knee lat]
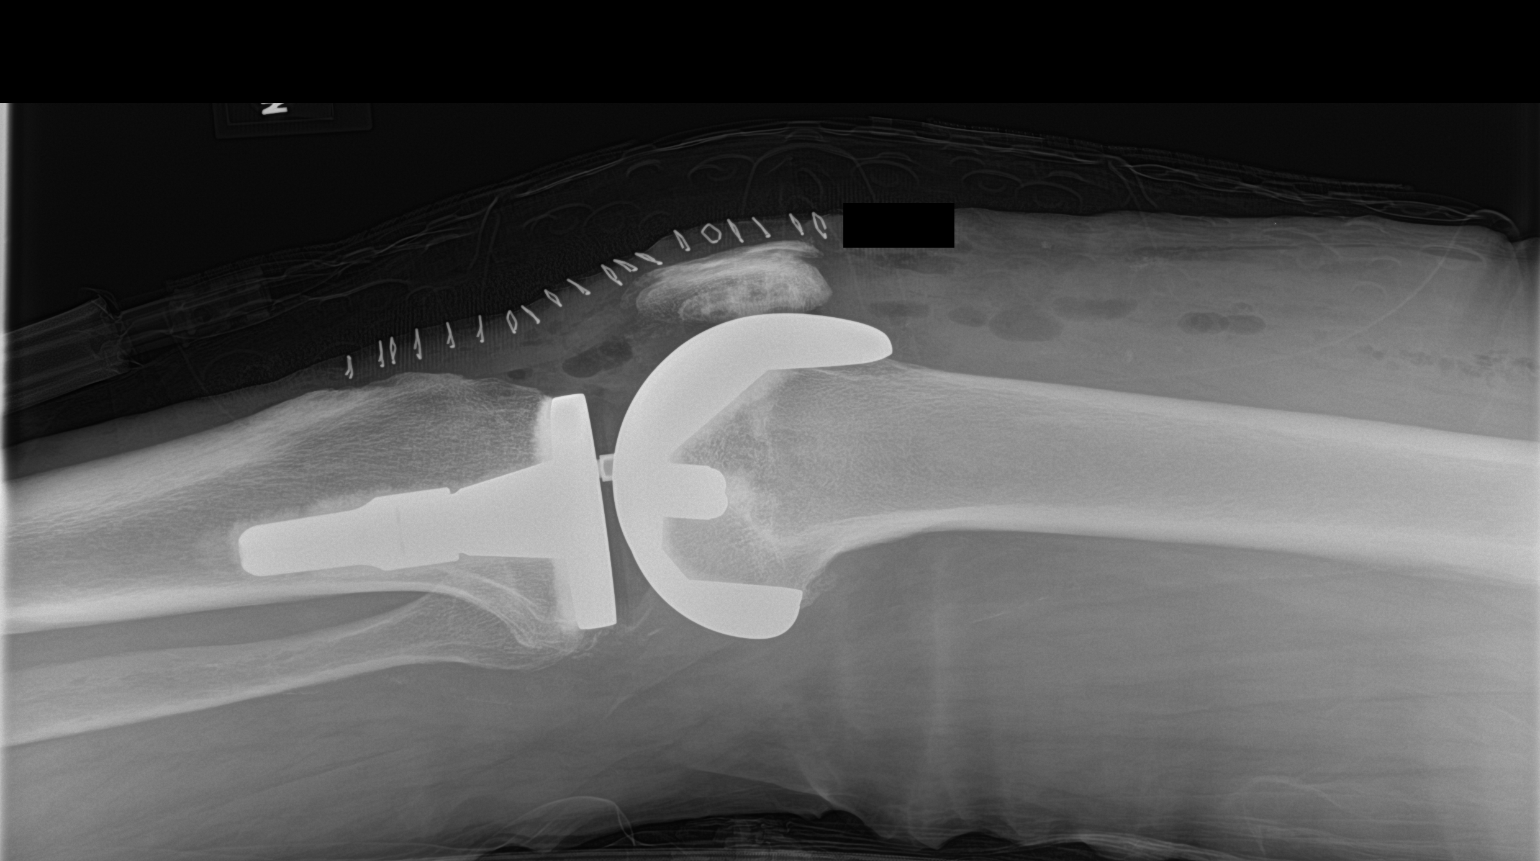

[2 of 2 positions shown; findings below may reference images not displayed]

FINDINGS: Post left total knee arthroplasty with small amounts of gas in the
soft tissues about the knee. No signs of fracture about hardware. No
unexpected postoperative findings. Alignment is anatomic.
IMPRESSION: Post left total knee arthroplasty without unexpected findings.

## 2021-03-19 ENCOUNTER — Encounter: Payer: Self-pay | Admitting: *Deleted

## 2021-03-19 ENCOUNTER — Emergency Department
Admission: EM | Admit: 2021-03-19 | Discharge: 2021-03-19 | Disposition: A | Discharge status: Home / Self Care | Payer: Medicare Other | Attending: Emergency Medicine | Admitting: Emergency Medicine

## 2021-03-19 ENCOUNTER — Emergency Department: Payer: Medicare Other

## 2021-03-19 ENCOUNTER — Other Ambulatory Visit: Payer: Self-pay

## 2021-03-19 DIAGNOSIS — Z5321 Procedure and treatment not carried out due to patient leaving prior to being seen by health care provider: Secondary | ICD-10-CM | POA: Insufficient documentation

## 2021-03-19 DIAGNOSIS — R110 Nausea: Secondary | ICD-10-CM | POA: Insufficient documentation

## 2021-03-19 DIAGNOSIS — R55 Syncope and collapse: Secondary | ICD-10-CM | POA: Insufficient documentation

## 2021-03-19 LAB — CBC
HCT: 41.2 % (ref 39.0–52.0)
Hemoglobin: 14.7 g/dL (ref 13.0–17.0)
MCH: 33.3 pg (ref 26.0–34.0)
MCHC: 35.7 g/dL (ref 30.0–36.0)
MCV: 93.2 fL (ref 80.0–100.0)
Platelets: 185 10*3/uL (ref 150–400)
RBC: 4.42 MIL/uL (ref 4.22–5.81)
RDW: 12.4 % (ref 11.5–15.5)
WBC: 8.1 10*3/uL (ref 4.0–10.5)
nRBC: 0 % (ref 0.0–0.2)

## 2021-03-19 LAB — HEPATIC FUNCTION PANEL
ALT: 20 U/L (ref 0–44)
AST: 23 U/L (ref 15–41)
Albumin: 4.2 g/dL (ref 3.5–5.0)
Alkaline Phosphatase: 54 U/L (ref 38–126)
Bilirubin, Direct: <0.1 mg/dL (ref 0.0–0.2)
Total Bilirubin: 0.7 mg/dL (ref 0.3–1.2)
Total Protein: 6.5 g/dL (ref 6.5–8.1)

## 2021-03-19 LAB — BASIC METABOLIC PANEL
Anion gap: 6 (ref 5–15)
BUN: 26 mg/dL — ABNORMAL HIGH (ref 8–23)
CO2: 26 mmol/L (ref 22–32)
Calcium: 8.8 mg/dL — ABNORMAL LOW (ref 8.9–10.3)
Chloride: 103 mmol/L (ref 98–111)
Creatinine, Ser: 1.38 mg/dL — ABNORMAL HIGH (ref 0.61–1.24)
GFR, Estimated: 53 mL/min — ABNORMAL LOW (ref >60)
Glucose, Bld: 174 mg/dL — ABNORMAL HIGH (ref 70–99)
Potassium: 3.9 mmol/L (ref 3.5–5.1)
Sodium: 135 mmol/L (ref 135–145)

## 2021-03-19 LAB — LIPASE, BLOOD: Lipase: 39 U/L (ref 11–51)

## 2021-03-19 LAB — TROPONIN I (HIGH SENSITIVITY): Troponin I (High Sensitivity): 9 ng/L (ref <18)

## 2021-03-19 NOTE — ED Triage Notes (Signed)
Pt reporting he felt like he was going to vomit, he stood up and started walking to the bathroom and felt like he was going to pass out, so he laid down. Pt says he was sweating a lot. Pain in the right shoulder since falling early July and the pain is a lot worse tonight. Took hydrocodone  for the pain.

## 2021-03-24 ENCOUNTER — Telehealth: Payer: Self-pay | Admitting: Emergency Medicine

## 2021-03-24 NOTE — Telephone Encounter (Signed)
Called patient due to left emergency department before provider exam to inquire about condition and follow up plans. He says he has appt with pcp in couple weeks, but he did go to the ortho.  I advised him to call pcp tomorrow and explaine what happened.  Also told him his pcp needs to review the labs done here. He will call tomorrow

## 2021-07-19 ENCOUNTER — Other Ambulatory Visit: Payer: Self-pay | Admitting: Student

## 2021-07-19 DIAGNOSIS — M7581 Other shoulder lesions, right shoulder: Secondary | ICD-10-CM

## 2021-07-19 DIAGNOSIS — S46011A Strain of muscle(s) and tendon(s) of the rotator cuff of right shoulder, initial encounter: Secondary | ICD-10-CM

## 2021-07-23 ENCOUNTER — Ambulatory Visit
Admission: RE | Admit: 2021-07-23 | Discharge: 2021-07-23 | Disposition: A | Discharge status: Home / Self Care | Payer: Medicare Other | Source: Ambulatory Visit | Attending: Student | Admitting: Student

## 2021-07-23 ENCOUNTER — Other Ambulatory Visit: Payer: Self-pay

## 2021-07-23 DIAGNOSIS — M7581 Other shoulder lesions, right shoulder: Secondary | ICD-10-CM | POA: Insufficient documentation

## 2021-07-23 DIAGNOSIS — S46011A Strain of muscle(s) and tendon(s) of the rotator cuff of right shoulder, initial encounter: Secondary | ICD-10-CM | POA: Insufficient documentation

## 2021-08-13 ENCOUNTER — Other Ambulatory Visit: Payer: Self-pay | Admitting: Surgery

## 2021-08-17 ENCOUNTER — Other Ambulatory Visit
Admission: RE | Admit: 2021-08-17 | Discharge: 2021-08-17 | Disposition: A | Discharge status: Home / Self Care | Payer: Medicare Other | Source: Ambulatory Visit | Attending: Surgery | Admitting: Surgery

## 2021-08-17 ENCOUNTER — Other Ambulatory Visit: Payer: Self-pay

## 2021-08-17 VITALS — BP 132/70 | HR 68 | Temp 97.5°F | Resp 20 | Ht 66.0 in | Wt 170.4 lb

## 2021-08-17 DIAGNOSIS — Z20822 Contact with and (suspected) exposure to covid-19: Secondary | ICD-10-CM | POA: Diagnosis not present

## 2021-08-17 DIAGNOSIS — Z01818 Encounter for other preprocedural examination: Secondary | ICD-10-CM | POA: Diagnosis present

## 2021-08-17 LAB — COMPREHENSIVE METABOLIC PANEL
ALT: 19 U/L (ref 0–44)
AST: 20 U/L (ref 15–41)
Albumin: 4.3 g/dL (ref 3.5–5.0)
Alkaline Phosphatase: 50 U/L (ref 38–126)
Anion gap: 8 (ref 5–15)
BUN: 19 mg/dL (ref 8–23)
CO2: 26 mmol/L (ref 22–32)
Calcium: 9.4 mg/dL (ref 8.9–10.3)
Chloride: 104 mmol/L (ref 98–111)
Creatinine, Ser: 1.16 mg/dL (ref 0.61–1.24)
GFR, Estimated: >60 mL/min (ref >60)
Glucose, Bld: 97 mg/dL (ref 70–99)
Potassium: 4.1 mmol/L (ref 3.5–5.1)
Sodium: 138 mmol/L (ref 135–145)
Total Bilirubin: 0.9 mg/dL (ref 0.3–1.2)
Total Protein: 7.1 g/dL (ref 6.5–8.1)

## 2021-08-17 LAB — TYPE AND SCREEN
ABO/RH(D): A POS
Antibody Screen: NEGATIVE

## 2021-08-17 LAB — URINALYSIS, ROUTINE W REFLEX MICROSCOPIC
Bilirubin Urine: NEGATIVE
Glucose, UA: NEGATIVE mg/dL
Hgb urine dipstick: NEGATIVE
Ketones, ur: NEGATIVE mg/dL
Leukocytes,Ua: NEGATIVE
Nitrite: NEGATIVE
Protein, ur: NEGATIVE mg/dL
Specific Gravity, Urine: 1.01 (ref 1.005–1.030)
pH: 8 (ref 5.0–8.0)

## 2021-08-17 LAB — CBC WITH DIFFERENTIAL/PLATELET
Abs Immature Granulocytes: 0.02 10*3/uL (ref 0.00–0.07)
Basophils Absolute: 0.1 10*3/uL (ref 0.0–0.1)
Basophils Relative: 2 %
Eosinophils Absolute: 0.3 10*3/uL (ref 0.0–0.5)
Eosinophils Relative: 5 %
HCT: 42.8 % (ref 39.0–52.0)
Hemoglobin: 14.9 g/dL (ref 13.0–17.0)
Immature Granulocytes: 0 %
Lymphocytes Relative: 25 %
Lymphs Abs: 1.3 10*3/uL (ref 0.7–4.0)
MCH: 32 pg (ref 26.0–34.0)
MCHC: 34.8 g/dL (ref 30.0–36.0)
MCV: 91.8 fL (ref 80.0–100.0)
Monocytes Absolute: 0.5 10*3/uL (ref 0.1–1.0)
Monocytes Relative: 10 %
Neutro Abs: 3 10*3/uL (ref 1.7–7.7)
Neutrophils Relative %: 58 %
Platelets: 200 10*3/uL (ref 150–400)
RBC: 4.66 MIL/uL (ref 4.22–5.81)
RDW: 12.2 % (ref 11.5–15.5)
WBC: 5.2 10*3/uL (ref 4.0–10.5)
nRBC: 0 % (ref 0.0–0.2)

## 2021-08-17 LAB — SURGICAL PCR SCREEN
MRSA, PCR: NEGATIVE
Staphylococcus aureus: NEGATIVE

## 2021-08-17 LAB — SARS CORONAVIRUS 2 (TAT 6-24 HRS): SARS Coronavirus 2: NEGATIVE

## 2021-08-17 NOTE — Patient Instructions (Addendum)
Your procedure is scheduled on: 08/19/2021  Report to the Registration Desk on the 1st floor of the [facility]. To find out your arrival time, please call [phone] between 1PM - 3PM on: 08/18/2021   REMEMBER: Instructions that are not followed completely may result in serious medical risk, up to and including death; or upon the discretion of your surgeon and anesthesiologist your surgery may need to be rescheduled.  Do not eat food after midnight the night before surgery.  No gum chewing, lozengers or hard candies.  You may however, drink CLEAR liquids up to 2 hours before you are scheduled to arrive for your surgery. Do not drink anything within 2 hours of your scheduled arrival time.  Clear liquids include: - water  - apple juice without pulp - gatorade (not RED, PURPLE, OR BLUE) - black coffee or tea (Do NOT add milk or creamers to the coffee or tea) Do NOT drink anything that is not on this list.    In addition, your doctor has ordered for you to drink the provided  Ensure Pre-Surgery Clear Carbohydrate Drink   Drinking this carbohydrate drink up to two hours before surgery helps to reduce insulin resistance and improve patient outcomes. Please complete drinking 2 hours prior to scheduled arrival time.  TAKE THESE MEDICATIONS THE MORNING OF SURGERY WITH A SIP OF WATER: Amlodipine Pepcid     One week prior to surgery: Stop Anti-inflammatories (NSAIDS) such as Advil, Aleve, Ibuprofen, Motrin, Naproxen, Naprosyn and Aspirin based products such as Excedrin, Goodys Powder, BC Powder.  Stop ANY OVER THE COUNTER supplements until after surgery. You may however, continue to take Tylenol if needed for pain up until the day of surgery.  No Alcohol for 24 hours before or after surgery.  No Smoking including e-cigarettes for 24 hours prior to surgery.  No chewable tobacco products for at least 6 hours prior to surgery.  No nicotine patches on the day of surgery.  Do not use  any "recreational" drugs for at least a week prior to your surgery.  Please be advised that the combination of cocaine and anesthesia may have negative outcomes, up to and including death. If you test positive for cocaine, your surgery will be cancelled.  On the morning of surgery brush your teeth with toothpaste and water, you may rinse your mouth with mouthwash if you wish. Do not swallow any toothpaste or mouthwash.  Use CHG Soap or wipes as directed on instruction sheet.  Do not wear jewelry, make-up, hairpins, clips or nail polish.  Do not wear lotions, powders, or perfumes.   Do not shave body from the neck down 48 hours prior to surgery just in case you cut yourself which could leave a site for infection.  Also, freshly shaved skin may become irritated if using the CHG soap.  Contact lenses, hearing aids and dentures may not be worn into surgery.  Do not bring valuables to the hospital. [facility] is not responsible for any missing/lost belongings or valuables.   Total Shoulder Arthroplasty:  use Benzolyl Peroxide 5% Gel as directed on instruction sheet.   Notify your doctor if there is any change in your medical condition (cold, fever, infection).  Wear comfortable clothing (specific to your surgery type) to the hospital.  After surgery, you can help prevent lung complications by doing breathing exercises.  Take deep breaths and cough every 1-2 hours. Your doctor may order a device called an Incentive Spirometer to help you take deep breaths.  If you are being admitted to the hospital overnight, leave your suitcase in the car. After surgery it may be brought to your room.  If you are being discharged the day of surgery, you will not be allowed to drive home. You will need a responsible adult (18 years or older) to drive you home and stay with you that night.   If you are taking public transportation, you will need to have a responsible adult (18 years or older) with  you. Please confirm with your physician that it is acceptable to use public transportation.   Please call the [place] Dept. at [phone] if you have any questions about these instructions.  Surgery Visitation Policy:  Patients undergoing a surgery or procedure may have one family member or support person with them as long as that person is not COVID-19 positive or experiencing its symptoms.  That person may remain in the waiting area during the procedure and may rotate out with other people.  Inpatient Visitation:    Visiting hours are 7 a.m. to 8 p.m. Up to two visitors ages 16+ are allowed at one time in a patient room. The visitors may rotate out with other people during the day. Visitors must check out when they leave, or other visitors will not be allowed. One designated support person may remain overnight. The visitor must pass COVID-19 screenings, use hand sanitizer when entering and exiting the patients room and wear a mask at all times, including in the patients room. Patients must also wear a mask when staff or their visitor are in the room. Masking is required regardless of vaccination status.

## 2021-08-18 MED ORDER — CEFAZOLIN SODIUM-DEXTROSE 2-4 GM/100ML-% IV SOLN
2.0000 g | INTRAVENOUS | Status: AC
  Administered 2021-08-19: 2 g via INTRAVENOUS

## 2021-08-18 MED ORDER — ORAL CARE MOUTH RINSE: 15.0000 mL | Freq: Once | OROMUCOSAL | Status: AC

## 2021-08-18 MED ORDER — LACTATED RINGERS IV SOLN: INTRAVENOUS | Status: DC

## 2021-08-18 MED ORDER — CHLORHEXIDINE GLUCONATE 0.12 % MT SOLN: 15.0000 mL | Freq: Once | OROMUCOSAL | Status: AC

## 2021-08-19 ENCOUNTER — Ambulatory Visit: Payer: Medicare Other

## 2021-08-19 ENCOUNTER — Ambulatory Visit
Admission: RE | Admit: 2021-08-19 | Discharge: 2021-08-19 | Disposition: A | Discharge status: Home / Self Care | Payer: Medicare Other | Attending: Surgery | Admitting: Surgery

## 2021-08-19 ENCOUNTER — Ambulatory Visit: Payer: Medicare Other | Admitting: Urgent Care

## 2021-08-19 ENCOUNTER — Other Ambulatory Visit: Payer: Self-pay

## 2021-08-19 ENCOUNTER — Ambulatory Visit: Payer: Medicare Other | Admitting: Certified Registered Nurse Anesthetist

## 2021-08-19 ENCOUNTER — Encounter: Payer: Self-pay | Admitting: Surgery

## 2021-08-19 ENCOUNTER — Encounter
Admission: RE | Disposition: A | Discharge status: Home / Self Care | Payer: Self-pay | Source: Home / Self Care | Attending: Surgery

## 2021-08-19 DIAGNOSIS — I10 Essential (primary) hypertension: Secondary | ICD-10-CM | POA: Insufficient documentation

## 2021-08-19 DIAGNOSIS — Z85828 Personal history of other malignant neoplasm of skin: Secondary | ICD-10-CM | POA: Insufficient documentation

## 2021-08-19 DIAGNOSIS — W19XXXA Unspecified fall, initial encounter: Secondary | ICD-10-CM | POA: Insufficient documentation

## 2021-08-19 DIAGNOSIS — K219 Gastro-esophageal reflux disease without esophagitis: Secondary | ICD-10-CM | POA: Insufficient documentation

## 2021-08-19 DIAGNOSIS — Z419 Encounter for procedure for purposes other than remedying health state, unspecified: Secondary | ICD-10-CM

## 2021-08-19 DIAGNOSIS — M75121 Complete rotator cuff tear or rupture of right shoulder, not specified as traumatic: Secondary | ICD-10-CM | POA: Insufficient documentation

## 2021-08-19 DIAGNOSIS — Z96611 Presence of right artificial shoulder joint: Secondary | ICD-10-CM

## 2021-08-19 DIAGNOSIS — R531 Weakness: Secondary | ICD-10-CM | POA: Diagnosis not present

## 2021-08-19 DIAGNOSIS — M25511 Pain in right shoulder: Secondary | ICD-10-CM | POA: Diagnosis present

## 2021-08-19 DIAGNOSIS — Z96653 Presence of artificial knee joint, bilateral: Secondary | ICD-10-CM | POA: Insufficient documentation

## 2021-08-19 HISTORY — PX: REVERSE SHOULDER ARTHROPLASTY: SHX5054

## 2021-08-19 SURGERY — ARTHROPLASTY, SHOULDER, TOTAL, REVERSE
Anesthesia: General | Site: Shoulder | Laterality: Right

## 2021-08-19 MED ORDER — BUPIVACAINE LIPOSOME 1.3 % IJ SUSP
INTRAMUSCULAR | Status: DC | PRN
  Administered 2021-08-19: 10 mL via PERINEURAL

## 2021-08-19 MED ORDER — EPHEDRINE SULFATE (PRESSORS) 50 MG/ML IJ SOLN
INTRAMUSCULAR | Status: DC | PRN
  Administered 2021-08-19 (×2): 5 mg via INTRAVENOUS
  Administered 2021-08-19: 2.5 mg via INTRAVENOUS

## 2021-08-19 MED ORDER — ONDANSETRON HCL 4 MG/2ML IJ SOLN: 4.0000 mg | Freq: Four times a day (QID) | INTRAMUSCULAR | Status: DC | PRN

## 2021-08-19 MED ORDER — BUPIVACAINE-EPINEPHRINE (PF) 0.5% -1:200000 IJ SOLN
INTRAMUSCULAR | Status: DC | PRN
  Administered 2021-08-19: 40 mL

## 2021-08-19 MED ORDER — PROPOFOL 10 MG/ML IV BOLUS
INTRAVENOUS | Status: DC | PRN
Start: 2021-08-19 — End: 2021-08-19
  Administered 2021-08-19: 30 mg via INTRAVENOUS
  Administered 2021-08-19: 150 mg via INTRAVENOUS

## 2021-08-19 MED ORDER — EPHEDRINE 5 MG/ML INJ
INTRAVENOUS | Status: AC
  Filled 2021-08-19: qty 5

## 2021-08-19 MED ORDER — BUPIVACAINE-EPINEPHRINE (PF) 0.25% -1:200000 IJ SOLN
INTRAMUSCULAR | Status: AC
  Filled 2021-08-19: qty 30

## 2021-08-19 MED ORDER — FENTANYL CITRATE (PF) 100 MCG/2ML IJ SOLN
INTRAMUSCULAR | Status: AC
  Filled 2021-08-19: qty 2

## 2021-08-19 MED ORDER — PROPOFOL 10 MG/ML IV BOLUS
INTRAVENOUS | Status: AC
  Filled 2021-08-19: qty 80

## 2021-08-19 MED ORDER — BUPIVACAINE LIPOSOME 1.3 % IJ SUSP
INTRAMUSCULAR | Status: AC
  Filled 2021-08-19: qty 10

## 2021-08-19 MED ORDER — TRANEXAMIC ACID 1000 MG/10ML IV SOLN
INTRAVENOUS | Status: AC
  Filled 2021-08-19: qty 10

## 2021-08-19 MED ORDER — PROPOFOL 10 MG/ML IV BOLUS
INTRAVENOUS | Status: AC
  Filled 2021-08-19: qty 20

## 2021-08-19 MED ORDER — ACETAMINOPHEN 500 MG PO TABS: 1000.0000 mg | ORAL_TABLET | Freq: Once | ORAL | Status: DC

## 2021-08-19 MED ORDER — FENTANYL CITRATE (PF) 100 MCG/2ML IJ SOLN
INTRAMUSCULAR | Status: DC | PRN
  Administered 2021-08-19: 25 ug via INTRAVENOUS
  Administered 2021-08-19 (×2): 50 ug via INTRAVENOUS

## 2021-08-19 MED ORDER — CEFAZOLIN SODIUM-DEXTROSE 2-4 GM/100ML-% IV SOLN
INTRAVENOUS | Status: AC
  Filled 2021-08-19: qty 100

## 2021-08-19 MED ORDER — SODIUM CHLORIDE FLUSH 0.9 % IV SOLN
INTRAVENOUS | Status: AC
  Filled 2021-08-19: qty 20

## 2021-08-19 MED ORDER — LIDOCAINE HCL (CARDIAC) PF 100 MG/5ML IV SOSY
PREFILLED_SYRINGE | INTRAVENOUS | Status: DC | PRN
  Administered 2021-08-19: 70 mg via INTRAVENOUS

## 2021-08-19 MED ORDER — ROCURONIUM BROMIDE 10 MG/ML (PF) SYRINGE
PREFILLED_SYRINGE | INTRAVENOUS | Status: AC
  Filled 2021-08-19: qty 10

## 2021-08-19 MED ORDER — ACETAMINOPHEN 10 MG/ML IV SOLN
INTRAVENOUS | Status: AC
  Filled 2021-08-19: qty 100

## 2021-08-19 MED ORDER — TRANEXAMIC ACID 1000 MG/10ML IV SOLN
INTRAVENOUS | Status: DC | PRN
  Administered 2021-08-19: 1000 mg via TOPICAL

## 2021-08-19 MED ORDER — METOCLOPRAMIDE HCL 10 MG PO TABS: 5.0000 mg | ORAL_TABLET | Freq: Three times a day (TID) | ORAL | Status: DC | PRN

## 2021-08-19 MED ORDER — CHLORHEXIDINE GLUCONATE 0.12 % MT SOLN
OROMUCOSAL | Status: AC
  Administered 2021-08-19: 15 mL via OROMUCOSAL
  Filled 2021-08-19: qty 15

## 2021-08-19 MED ORDER — LIDOCAINE HCL (PF) 1 % IJ SOLN
INTRAMUSCULAR | Status: DC | PRN
  Administered 2021-08-19: 3 mL via SUBCUTANEOUS

## 2021-08-19 MED ORDER — MIDAZOLAM HCL 2 MG/2ML IJ SOLN: 1.0000 mg | INTRAMUSCULAR | Status: DC | PRN

## 2021-08-19 MED ORDER — OXYCODONE HCL 5 MG PO TABS: 5.0000 mg | ORAL_TABLET | ORAL | Status: DC | PRN

## 2021-08-19 MED ORDER — 0.9 % SODIUM CHLORIDE (POUR BTL) OPTIME
TOPICAL | Status: DC | PRN
  Administered 2021-08-19: 300 mL

## 2021-08-19 MED ORDER — ACETAMINOPHEN 10 MG/ML IV SOLN
INTRAVENOUS | Status: DC | PRN
  Administered 2021-08-19: 1000 mg via INTRAVENOUS

## 2021-08-19 MED ORDER — ONDANSETRON HCL 4 MG/2ML IJ SOLN
INTRAMUSCULAR | Status: AC
  Filled 2021-08-19: qty 2

## 2021-08-19 MED ORDER — GLYCOPYRROLATE 0.2 MG/ML IJ SOLN
INTRAMUSCULAR | Status: DC | PRN
Start: 2021-08-19 — End: 2021-08-19
  Administered 2021-08-19: .2 mg via INTRAVENOUS

## 2021-08-19 MED ORDER — BUPIVACAINE LIPOSOME 1.3 % IJ SUSP
INTRAMUSCULAR | Status: AC
  Filled 2021-08-19: qty 20

## 2021-08-19 MED ORDER — LIDOCAINE HCL (PF) 2 % IJ SOLN
INTRAMUSCULAR | Status: AC
  Filled 2021-08-19: qty 5

## 2021-08-19 MED ORDER — PHENYLEPHRINE HCL-NACL 20-0.9 MG/250ML-% IV SOLN
INTRAVENOUS | Status: DC | PRN
Start: 2021-08-19 — End: 2021-08-19
  Administered 2021-08-19: 50 ug/min via INTRAVENOUS

## 2021-08-19 MED ORDER — BUPIVACAINE-EPINEPHRINE (PF) 0.5% -1:200000 IJ SOLN
INTRAMUSCULAR | Status: AC
  Filled 2021-08-19: qty 30

## 2021-08-19 MED ORDER — SUGAMMADEX SODIUM 200 MG/2ML IV SOLN
INTRAVENOUS | Status: DC | PRN
  Administered 2021-08-19: 180 mg via INTRAVENOUS

## 2021-08-19 MED ORDER — METOCLOPRAMIDE HCL 5 MG/ML IJ SOLN: 5.0000 mg | Freq: Three times a day (TID) | INTRAMUSCULAR | Status: DC | PRN

## 2021-08-19 MED ORDER — PHENYLEPHRINE HCL (PRESSORS) 10 MG/ML IV SOLN
INTRAVENOUS | Status: DC | PRN
  Administered 2021-08-19 (×2): 80 ug via INTRAVENOUS
  Administered 2021-08-19: 160 ug via INTRAVENOUS
  Administered 2021-08-19: 80 ug via INTRAVENOUS

## 2021-08-19 MED ORDER — BUPIVACAINE HCL (PF) 0.5 % IJ SOLN
INTRAMUSCULAR | Status: DC | PRN
  Administered 2021-08-19: 20 mL via PERINEURAL

## 2021-08-19 MED ORDER — PHENYLEPHRINE HCL (PRESSORS) 10 MG/ML IV SOLN
INTRAVENOUS | Status: AC
  Filled 2021-08-19: qty 1

## 2021-08-19 MED ORDER — ONDANSETRON HCL 4 MG/2ML IJ SOLN: 4.0000 mg | Freq: Once | INTRAMUSCULAR | Status: DC | PRN

## 2021-08-19 MED ORDER — ONDANSETRON HCL 4 MG PO TABS: 4.0000 mg | ORAL_TABLET | Freq: Four times a day (QID) | ORAL | Status: DC | PRN

## 2021-08-19 MED ORDER — MIDAZOLAM HCL 2 MG/2ML IJ SOLN
INTRAMUSCULAR | Status: AC
  Administered 2021-08-19: 1 mg via INTRAVENOUS
  Filled 2021-08-19: qty 2

## 2021-08-19 MED ORDER — OXYCODONE HCL 5 MG PO TABS: 5.0000 mg | ORAL_TABLET | ORAL | 0 refills | Status: AC | PRN

## 2021-08-19 MED ORDER — SODIUM CHLORIDE 0.9 % IR SOLN
Status: DC | PRN
  Administered 2021-08-19: 3000 mL

## 2021-08-19 MED ORDER — ONDANSETRON HCL 4 MG/2ML IJ SOLN
INTRAMUSCULAR | Status: DC | PRN
  Administered 2021-08-19: 4 mg via INTRAVENOUS

## 2021-08-19 MED ORDER — GLYCOPYRROLATE 0.2 MG/ML IJ SOLN
INTRAMUSCULAR | Status: AC
  Filled 2021-08-19: qty 1

## 2021-08-19 MED ORDER — ROCURONIUM BROMIDE 100 MG/10ML IV SOLN
INTRAVENOUS | Status: DC | PRN
  Administered 2021-08-19: 50 mg via INTRAVENOUS
  Administered 2021-08-19: 5 mg via INTRAVENOUS
  Administered 2021-08-19: 20 mg via INTRAVENOUS

## 2021-08-19 MED ORDER — KETOROLAC TROMETHAMINE 15 MG/ML IJ SOLN
15.0000 mg | Freq: Once | INTRAMUSCULAR | Status: AC
  Administered 2021-08-19: 15 mg via INTRAVENOUS

## 2021-08-19 MED ORDER — KETOROLAC TROMETHAMINE 15 MG/ML IJ SOLN
INTRAMUSCULAR | Status: AC
  Filled 2021-08-19: qty 1

## 2021-08-19 MED ORDER — CEFAZOLIN SODIUM-DEXTROSE 2-4 GM/100ML-% IV SOLN
2.0000 g | Freq: Four times a day (QID) | INTRAVENOUS | Status: DC
  Administered 2021-08-19: 2 g via INTRAVENOUS

## 2021-08-19 MED ORDER — FENTANYL CITRATE (PF) 100 MCG/2ML IJ SOLN: 25.0000 ug | INTRAMUSCULAR | Status: DC | PRN

## 2021-08-19 MED ORDER — SODIUM CHLORIDE 0.9 % IV SOLN: INTRAVENOUS | Status: DC

## 2021-08-19 MED ORDER — LIDOCAINE HCL (PF) 1 % IJ SOLN
INTRAMUSCULAR | Status: AC
  Filled 2021-08-19: qty 5

## 2021-08-19 MED ORDER — BUPIVACAINE HCL (PF) 0.5 % IJ SOLN
INTRAMUSCULAR | Status: AC
  Filled 2021-08-19: qty 20

## 2021-08-19 SURGICAL SUPPLY — 67 items
APL PRP STRL LF DISP 70% ISPRP (MISCELLANEOUS) ×3
BASEPLATE GLENOSPHERE 25 (Plate) ×1 IMPLANT
BEARING CROSSLINK RSA 36 (Joint) ×1 IMPLANT
BIT DRILL TWIST 2.7 (BIT) ×1 IMPLANT
BLADE SAW SAG 25X90X1.19 (BLADE) ×3 IMPLANT
BRNG HUM STD 36 RVRS SHDR PRLG (Joint) ×1 IMPLANT
CHLORAPREP W/TINT 26 (MISCELLANEOUS) ×4 IMPLANT
COOLER POLAR GLACIER W/PUMP (MISCELLANEOUS) ×2 IMPLANT
COVER BACK TABLE REUSABLE LG (DRAPES) ×2 IMPLANT
DRAPE 3/4 80X56 (DRAPES) ×4 IMPLANT
DRAPE IMP U-DRAPE 54X76 (DRAPES) ×4 IMPLANT
DRAPE INCISE IOBAN 66X45 STRL (DRAPES) ×4 IMPLANT
DRSG OPSITE POSTOP 4X6 (GAUZE/BANDAGES/DRESSINGS) ×1 IMPLANT
DRSG OPSITE POSTOP 4X8 (GAUZE/BANDAGES/DRESSINGS) ×1 IMPLANT
ELECT BLADE 6.5 EXT (BLADE) IMPLANT
ELECT CAUTERY BLADE 6.4 (BLADE) ×2 IMPLANT
ELECT REM PT RETURN 9FT ADLT (ELECTROSURGICAL) ×2
ELECTRODE REM PT RTRN 9FT ADLT (ELECTROSURGICAL) ×1 IMPLANT
GAUZE XEROFORM 1X8 LF (GAUZE/BANDAGES/DRESSINGS) ×2 IMPLANT
GLENOID SPHERE STD STRL 36MM (Orthopedic Implant) ×1 IMPLANT
GLOVE SRG 8 PF TXTR STRL LF DI (GLOVE) ×2 IMPLANT
GLOVE SURG ENC MOIS LTX SZ7.5 (GLOVE) ×8 IMPLANT
GLOVE SURG ENC MOIS LTX SZ8 (GLOVE) ×8 IMPLANT
GLOVE SURG UNDER LTX SZ8 (GLOVE) ×2 IMPLANT
GLOVE SURG UNDER POLY LF SZ8 (GLOVE) ×4
GOWN STRL REUS W/ TWL LRG LVL3 (GOWN DISPOSABLE) ×1 IMPLANT
GOWN STRL REUS W/ TWL XL LVL3 (GOWN DISPOSABLE) ×1 IMPLANT
GOWN STRL REUS W/TWL LRG LVL3 (GOWN DISPOSABLE) ×2
GOWN STRL REUS W/TWL XL LVL3 (GOWN DISPOSABLE) ×2
IV NS IRRIG 3000ML ARTHROMATIC (IV SOLUTION) ×2 IMPLANT
KIT STABILIZATION SHOULDER (MISCELLANEOUS) ×2 IMPLANT
KIT TURNOVER KIT A (KITS) ×2 IMPLANT
MANIFOLD NEPTUNE II (INSTRUMENTS) ×2 IMPLANT
MASK FACE SPIDER DISP (MASK) ×2 IMPLANT
MAT ABSORB  FLUID 56X50 GRAY (MISCELLANEOUS) ×1
MAT ABSORB FLUID 56X50 GRAY (MISCELLANEOUS) ×1 IMPLANT
NDL SAFETY ECLIPSE 18X1.5 (NEEDLE) ×1 IMPLANT
NDL SPNL 20GX3.5 QUINCKE YW (NEEDLE) ×1 IMPLANT
NEEDLE HYPO 18GX1.5 SHARP (NEEDLE) ×2
NEEDLE SPNL 20GX3.5 QUINCKE YW (NEEDLE) ×2 IMPLANT
NS IRRIG 500ML POUR BTL (IV SOLUTION) ×1 IMPLANT
PACK ARTHROSCOPY SHOULDER (MISCELLANEOUS) ×2 IMPLANT
PACK TOTAL KNEE (MISCELLANEOUS) IMPLANT
PAD ARMBOARD 7.5X6 YLW CONV (MISCELLANEOUS) ×2 IMPLANT
PAD WRAPON POLAR SHDR UNIV (MISCELLANEOUS) ×1 IMPLANT
PIN THREADED REVERSE (PIN) ×1 IMPLANT
PULSAVAC PLUS IRRIG FAN TIP (DISPOSABLE) ×2
SCREW BONE LOCKING 4.75X30X3.5 (Screw) ×2 IMPLANT
SCREW CENTRAL 6.5X40 (Screw) ×1 IMPLANT
SCREW NON-LOCK 4.75MMX15MM (Screw) ×2 IMPLANT
SLING ULTRA II M (MISCELLANEOUS) ×1 IMPLANT
SPONGE T-LAP 18X18 ~~LOC~~+RFID (SPONGE) ×4 IMPLANT
STAPLER SKIN PROX 35W (STAPLE) ×2 IMPLANT
STEM HUMERAL STRL 12MMX14MM (Stem) ×1 IMPLANT
SUT ETHIBOND 0 MO6 C/R (SUTURE) ×2 IMPLANT
SUT FIBERWIRE #2 38 BLUE 1/2 (SUTURE) ×4
SUT VIC AB 0 CT1 36 (SUTURE) ×2 IMPLANT
SUT VIC AB 2-0 CT1 27 (SUTURE) ×4
SUT VIC AB 2-0 CT1 TAPERPNT 27 (SUTURE) ×2 IMPLANT
SUTURE FIBERWR #2 38 BLUE 1/2 (SUTURE) ×4 IMPLANT
SYR 10ML LL (SYRINGE) ×2 IMPLANT
SYR 30ML LL (SYRINGE) ×2 IMPLANT
TIP FAN IRRIG PULSAVAC PLUS (DISPOSABLE) ×1 IMPLANT
TRAY HUM MINI SHOULDER +0 40D (Shoulder) ×1 IMPLANT
WATER STERILE IRR 1000ML POUR (IV SOLUTION) ×1 IMPLANT
WATER STERILE IRR 500ML POUR (IV SOLUTION) ×1 IMPLANT
WRAPON POLAR PAD SHDR UNIV (MISCELLANEOUS) ×2

## 2021-08-19 NOTE — Anesthesia Postprocedure Evaluation (Signed)
Anesthesia Post Note  Patient: Lee Hutchinson  Procedure(s) Performed: REVERSE SHOULDER ARTHROPLASTY - RNFA (Right: Shoulder)  Patient location during evaluation: PACU Anesthesia Type: General Level of consciousness: awake and alert Pain management: pain level controlled Vital Signs Assessment: post-procedure vital signs reviewed and stable Respiratory status: spontaneous breathing, nonlabored ventilation, respiratory function stable and patient connected to nasal cannula oxygen Cardiovascular status: blood pressure returned to baseline and stable Postop Assessment: no apparent nausea or vomiting Anesthetic complications: no   No notable events documented.   Last Vitals:  Vitals:   08/19/21 1124 08/19/21 1302  BP: 117/73 112/73  Pulse: 67 100  Resp: 18 18  Temp: 36.7 C   SpO2: 97% 99%    Last Pain:  Vitals:   08/19/21 1302  TempSrc:   PainSc: 0-No pain                 Molli Barrows

## 2021-08-19 NOTE — Anesthesia Preprocedure Evaluation (Signed)
Anesthesia Evaluation  Patient identified by MRN, date of birth, ID band Patient awake    Reviewed: Allergy & Precautions, H&P , NPO status , Patient's Chart, lab work & pertinent test results, reviewed documented beta blocker date and time   Airway Mallampati: II  TM Distance: >3 FB Neck ROM: full    Dental  (+) Teeth Intact   Pulmonary neg pulmonary ROS,    Pulmonary exam normal        Cardiovascular Exercise Tolerance: Good hypertension, On Medications negative cardio ROS Normal cardiovascular exam Rhythm:regular Rate:Normal     Neuro/Psych negative neurological ROS  negative psych ROS   GI/Hepatic Neg liver ROS, GERD  Medicated,  Endo/Other  negative endocrine ROS  Renal/GU negative Renal ROS  negative genitourinary   Musculoskeletal   Abdominal   Peds  Hematology negative hematology ROS (+)   Anesthesia Other Findings Past Medical History: No date: Cancer ([place])     Comment:  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 No date: GERD (gastroesophageal reflux disease) No date: Hypertension Past Surgical History: No date: HERNIA REPAIR     Comment:  as a child 2013: ROBOT ASSISTED LAPAROSCOPIC RADICAL PROSTATECTOMY 08/06/2019: TOTAL KNEE ARTHROPLASTY; Left     Comment:  Procedure: TOTAL KNEE ARTHROPLASTY;  Surgeon: Hessie Knows, MD;  Location: [facility] ORS;  Service: Orthopedics;               Laterality: Left; 06/16/2020: TOTAL KNEE ARTHROPLASTY; Right     Comment:  Procedure: TOTAL KNEE ARTHROPLASTY;  Surgeon: Hessie Knows, MD;  Location: [facility] ORS;  Service: Orthopedics;               Laterality: Right; BMI    Body Mass Index: 27.44 kg/m     Reproductive/Obstetrics negative OB ROS                             Anesthesia Physical Anesthesia Plan  ASA: 2  Anesthesia Plan: General ETT   Post-op Pain Management:    Induction:   PONV Risk Score and Plan:   Airway Management  Planned:   Additional Equipment:   Intra-op Plan:   Post-operative Plan:   Informed Consent: I have reviewed the patients History and Physical, chart, labs and discussed the procedure including the risks, benefits and alternatives for the proposed anesthesia with the patient or authorized representative who has indicated his/her understanding and acceptance.     Dental Advisory Given  Plan Discussed with: CRNA  Anesthesia Plan Comments:         Anesthesia Quick Evaluation

## 2021-08-19 NOTE — H&P (Signed)
History of Present Illness:  Lee Hutchinson is a 76 y.o. male who presents today as a result of a referral from Cameron Proud, PA-C, for right shoulder pain and weakness.   The patient's symptoms began following a fall onto his right shoulder in July, 2022. Several weeks later, he fell onto his right side again after passing out in his bathroom. The patient presented to the emergency room where x-rays were unremarkable. 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 Because of continued shoulder symptoms, he was referred to orthopedics. He saw Cameron Proud, PA-C, who ordered an MRI scan of the right shoulder and referred him to me for further evaluation and treatment. The patient describes the symptoms as moderate (patient is active but has had to make modifications or give up activities) and have the quality of being aching, miserable, nagging, stabbing, tender and throbbing. The pain is localized to the lateral arm/shoulder and tend to be worse at night, frequently awakening him from sleep. These symptoms are aggravated with sleeping, carrying heavy objects, at higher levels of activity, with overhead activity and reaching behind the back. He has tried acetaminophen with temporary partial relief. He has tried rest with limited benefit. He has not tried any formal physical therapy or steroid injections for the symptoms. This complaint is not work related. He is a sports non-participant.  Shoulder Surgical History:  The patient has had no shoulder surgery in the past.  PMH/PSH/Family History/Social History/Meds/Allergies:  I have reviewed past medical, surgical, social and family history, medications and allergies as documented in the EMR.  Current Outpatient Medications:  acetaminophen (TYLENOL ORAL) Take 500 mg by mouth every morning   amLODIPine (NORVASC) 10 MG tablet Take 1 tablet (10 mg total) by mouth once daily 90 tablet 3   atorvastatin (LIPITOR) 10 MG tablet Take  1 tablet (10 mg total) by mouth once daily 90 tablet 3   bisacodyL (DULCOLAX) 5 mg EC tablet bisacodyl 5 mg tablet,delayed release   cephalexin (KEFLEX) 500 MG capsule cephalexin 500 mg capsule TAKE 4 CAPSULES BY MOUTH 1 HOUR BEFORE DENTAL APPOINTMENT   diazePAM (VALIUM) 5 MG tablet Take 1 tablet 30 minutes-1 hour prior to MRI. Take one at time of MRI if needed. 2 tablet 0   docusate (COLACE) 100 MG capsule Take 100 mg by mouth 2 (two) times daily   EPINEPHrine (EPIPEN) 0.3 mg/0.3 mL auto-injector   famotidine (PEPCID) 20 MG tablet Take 20 mg by mouth 2 (two) times daily.   melatonin 5 mg Cap Take 1 capsule by mouth nightly   papaverine-phentolamine-prostaglandin (TRI-MIX) 12mg -1mg -5mcg/mL injection Inject 1 mL into the corpus cavernosa once daily as needed for Erectile Dysfunction 5 mL 11   Allergies:   Alpha-Gal (Galactose-Alpha-1,3-Galactose) Unknown   Past Medical History:   Cancer prostate   Diverticulitis   Elevated PSA   Hyperlipidemia   Hypertension   Pain of left thumb   Past Surgical History:   SCREENING COLONOSCOPY N/A 04/18/2012  Procedure: SCREENING COLONOSCOPY; Surgeon: Dalene Carrow, MD; Location: [place]; Service: Gastroenterology; Laterality: N/A;   REPAIR INCISIONAL HERNIA LAPAROSCOPIC N/A 07/03/2013  Procedure: LAPAROSCOPIC REPAIR INCISIONAL HERNIA midline incisional hernia repair; Surgeon: Thayer Ohm., MD; Location: [place]; Service: General Surgery; Laterality: N/A; midline incisional hernia repair   TOTAL KNEE ARTHROPLASTY (Left) Left 08/06/2019 (Dr. Rudene Christians)   [place] KNEE Right 06/16/2020 (Dr. Rudene Christians)   [place] abd prostatectomy   ADMITTED FOR DIVERTICULITIS   HERNIA REPAIR  as child   PROSTATECTOMY PERINEAL   REPLACEMENT TOTAL KNEE Right   UMBILICAL HERNIA REPAIR (with 20 cm mesh)   Family History:   Alzheimer's disease Mother   Lung cancer Father   High blood pressure (Hypertension) Brother   Colon polyps  Brother   Autoimmune disease Son ([place] Syndrome)   High blood pressure (Hypertension) Maternal Grandmother   Coronary Artery Disease Maternal Grandfather 52   Dementia Paternal Grandfather   Peripheral vascular disease Paternal Grandfather   Anesthesia problems Neg Hx   Social History:   Socioeconomic History:   Marital status: Married  Tobacco Use   Smoking status: Never   Smokeless tobacco: Never  Vaping Use   Vaping Use: Never used  Substance and Sexual Activity   Alcohol use: Yes  Alcohol/week: 14.0 standard drinks  Types: 14 Glasses of wine per week  Comment: 1 glasses of red wine daily   Drug use: No   Sexual activity: Yes  Partners: Female  Birth control/protection: Post-menopausal  Social History Narrative  Married for second time in 1999. Son born in 39. Grandson born in 1/15. Job: Teacher, adult education. Exercise: farming. Wears seatbelt, avoids sun. Dentist: yes. Calcium in diet: yes. Religious beliefs affecting health care: none.   Advanced Directives: wife has HCPOA. Wife and son know his wishes.   Review of Systems:  A comprehensive 14 point ROS was performed, reviewed, and the pertinent orthopaedic findings are documented in the HPI.  Physical Exam:  Vitals:  08/09/21 1147  BP: 124/86  Weight: 76.9 kg (169 lb 9.6 oz)  Height: 167.6 cm (5\' 6" )  PainSc: 2  PainLoc: Shoulder   General/Constitutional: The patient appears to be well-nourished, well-developed, and in no acute distress. Neuro/Psych: Normal mood and affect, oriented to person, place and time. Eyes: Non-icteric. Pupils are equal, round, and reactive to light, and exhibit synchronous movement. ENT: Unremarkable. Lymphatic: No palpable adenopathy. Respiratory: Lungs clear to auscultation, Normal chest excursion, No wheezes and Non-labored breathing Cardiovascular: Regular rate and rhythm. No murmurs. and No edema, swelling or tenderness, except as noted in detailed exam. Integumentary: No  impressive skin lesions present, except as noted in detailed exam. Musculoskeletal: Unremarkable, except as noted in detailed exam.  Right shoulder exam: SKIN: normal SWELLING: none WARMTH: none LYMPH NODES: no adenopathy palpable CREPITUS: none TENDERNESS: Non-tender ROM (active):  Forward flexion: 155 degrees Abduction: 150 degrees Internal rotation: L2 ROM (passive):  Forward flexion: 165 degrees Abduction: 160 degrees ER/IR at 90 abd: 90 degrees / 60 degrees  He has mild pain at the extremes of all motions.  STRENGTH: Forward flexion: 3+-4/5 Abduction: 4/5 External rotation: 4/5 Internal rotation: 4/5 Pain with RC testing: No  STABILITY: Normal  SPECIAL TESTS: Luan Pulling' test: Minimally positive Speed's test: negative Capsulitis - pain w/ passive ER: no Crossed arm test: no Crank: Not evaluated Anterior apprehension: Negative Posterior apprehension: Negative  He is neurovascularly intact to the right upper extremity.  Right Shoulder Imaging, MRI: MRI Shoulder Cartilage: Partial thickness humeral head cartilage loss. Partial thickness glenoid cartilage loss. MRI Shoulder Rotator Cuff: Full thickness tear of the superior two thirds of the subscapularis tendon, the entire supraspinatus tendon, and the anterior portion of the infraspinatus tendon.Marland Kitchen Retracted to the glenoid. MRI Shoulder Labrum / Biceps: No labral tear or biceps abormality. MRI Shoulder Bone: Normal bone.  Both the films and report were reviewed by myself and discussed with the patient.  Assessment:   Rotator cuff arthropathy, right.   Traumatic complete tear of right  rotator cuff.   Plan:  The treatment options were discussed with the patient. In addition, patient educational materials were provided regarding the diagnosis and treatment options. The patient is quite frustrated by his symptoms and functional limitations, and is ready to consider more aggressive treatment options. Therefore, given the  size of the tear, his age, and the underlying degenerative changes, I have recommended a surgical procedure, specifically a reverse right total shoulder arthroplasty. The procedure was discussed with the patient, as were the potential risks (including bleeding, infection, nerve and/or blood vessel injury, persistent or recurrent pain, loosening and/or failure of the components, dislocation, need for further surgery, blood clots, strokes, heart attacks and/or arhythmias, pneumonia, etc.) and benefits. The patient states his understanding and wishes to proceed. All of the patient's questions and concerns were answered. He can call any time with further concerns. He will follow up post-surgery, routine.   H&P reviewed and patient re-examined. No changes.

## 2021-08-19 NOTE — Transfer of Care (Signed)
Immediate Anesthesia Transfer of Care Note  Patient: Lee Hutchinson  Procedure(s) Performed: REVERSE SHOULDER ARTHROPLASTY - RNFA (Right: Shoulder)  Patient Location: PACU  Anesthesia Type:GA combined with regional for post-op pain  Level of Consciousness: awake, drowsy and patient cooperative  Airway & Oxygen Therapy: Patient Spontanous Breathing and Patient connected to face mask oxygen  Post-op Assessment: Report given to RN and Post -op Vital signs reviewed and stable  Post vital signs: Reviewed and stable  Last Vitals:  Vitals Value Taken Time  BP 95/60 08/19/21 1015  Temp    Pulse 73 08/19/21 1018  Resp 14 08/19/21 1018  SpO2 100 % 08/19/21 1018  Vitals shown include unvalidated device data.  Last Pain:  Vitals:   08/19/21 0624  TempSrc: Temporal  PainSc: 1          Complications: No notable events documented.

## 2021-08-19 NOTE — Discharge Instructions (Addendum)
Orthopedic discharge instructions: May shower with intact OpSite dressing once nerve block has worn off. Apply ice frequently to shoulder or use Polar Care device. Take ibuprofen 600-800 mg TID with meals for 5-7 days, then as necessary. Take oxycodone as prescribed when needed.  May supplement with ES Tylenol if necessary. Keep shoulder immobilizer on at all times except may remove for bathing purposes. Follow-up in 10-14 days or as scheduled.  POLAR CARE INFORMATION  [email or web address]  How to use [place] Cold Therapy System?   YouTube   [email or web address]  OPERATING INSTRUCTIONS  Start the product With dry hands, connect the transformer to the electrical connection located on the top of the cooler. Next, plug the transformer into an appropriate electrical outlet. The unit will automatically start running at this point.  To stop the pump, disconnect electrical power.  Unplug to stop the product when not in use. Unplugging the Polar Care unit turns it off. Always unplug immediately after use. Never leave it plugged in while unattended. Remove pad.    FIRST ADD WATER TO FILL LINE, THEN ICE---Replace ice when existing ice is almost melted  1 Discuss Treatment with your [place] Practitioner and Use Only as Prescribed 2 Apply Insulation Barrier & Cold Therapy Pad 3 Check for Moisture 4 Inspect Skin Regularly  Tips and Trouble Shooting Usage Tips 1. Use cubed or chunked ice for optimal performance. 2. It is recommended to drain the Pad between uses. To drain the pad, hold the Pad upright with the hose pointed toward the ground. Depress the black plunger and allow water to drain out. 3. You may disconnect the Pad from the unit without removing the pad from the affected area by depressing the silver tabs on the hose coupling and gently pulling the hoses apart. The Pad and unit will seal itself and will not leak. Note: Some dripping during  release is normal. 4. DO NOT RUN PUMP WITHOUT WATER! The pump in this unit is designed to run with water. Running the unit without water will cause permanent damage to the pump. 5. Unplug unit before removing lid.  TROUBLESHOOTING GUIDE Pump not running, Water not flowing to the pad, Pad is not getting cold 1. Make sure the transformer is plugged into the wall outlet. 2. Confirm that the ice and water are filled to the indicated levels. 3. Make sure there are no kinks in the pad. 4. Gently pull on the blue tube to make sure the tube/pad junction is straight. 5. Remove the pad from the treatment site and ll it while the pad is lying at; then reapply. 6. Confirm that the pad couplings are securely attached to the unit. Listen for the double clicks (Figure 1) to confirm the pad couplings are securely attached.  Leaks    Note: Some condensation on the lines, controller, and pads is unavoidable, especially in warmer climates. 1. If using a [place] Polar Care Cold Therapy unit with a detachable Cold Therapy Pad, and a leak exists (other than condensation on the lines) disconnect the pad couplings. Make sure the silver tabs on the couplings are depressed before reconnecting the pad to the pump hose; then confirm both sides of the coupling are properly clicked in. 2. If the coupling continues to leak or a leak is detected in the pad itself, stop using it and call [facility] at [phone].  Cleaning After use, empty and dry the unit with a soft cloth. Warm water and mild detergent  may be used occasionally to clean the pump and tubes.  WARNING: The [place] can be cold enough to cause serious injury, including full skin necrosis. Follow these Operating Instructions, and carefully read the Product Insert (see pouch on side of unit) and the Cold Therapy Pad Fitting Instructions (provided with each Cold Therapy Pad) prior to use.        Interscalene Nerve Block (ISNB) Discharge  Instructions    For your surgery you have received an Interscalene Nerve Block. Nerve Blocks affect many types of nerves, including nerves that control movement, pain and normal sensation.  You may experience feelings such as numbness, tingling, heaviness, weakness or the inability to move your arm or the feeling or sensation that your arm has "fallen asleep". A nerve block can last for 2 - 36 hours or more depending on the medication used.  Usually the weakness wears off first.  The tingling and heaviness usually wear off next.  Finally you may start to notice pain.  Keep in mind that this may occur in any order.  once a nerve block starts to wear off it is usually completely gone within 60 minutes. ISNB may cause mild shortness of breath, a hoarse voice, blurry vision, unequal pupils, or drooping of the face on the same side as the nerve block.  These symptoms will usually go away within 12 hours.  Very rarely the procedure itself can cause mild seizures. If needed, your surgeon will give you a prescription for pain medication.  It will take about 60 minutes for the oral pain medication to become fully effective.  So, it is recommended that you start taking this medication before the nerve block first begins to wear off, or when you first begin to feel discomfort. Keep in mind that nerve blocks often wear off in the middle of the night.   If you are going to bed and the block has not started to wear off or you have not started to have any discomfort, consider setting an alarm for 2 to 3 hours, so you can assess your block.  If you notice the block is wearing off or you are starting to have discomfort, you can take your pain medication. Take your pain medication only as prescribed.  Pain medication can cause sedation and decrease your breathing if you take more than you need for the level of pain that you have. Nausea is a common side effect of many pain medications.  You may want to eat something before  taking your pain medicine to prevent nausea. After an Interscalene nerve block, you cannot feel pain, pressure or extremes in temperature in the effected arm.  Because your arm is numb it is at an increased risk for injury.  To decrease the possibility of injury, please practice the following:  While you are awake change the position of your arm frequently to prevent too much pressure on any one area for prolonged periods of time.  If you have a cast or tight dressing, check the color or your fingers every couple of hours.  Call your surgeon with the appearance of any discoloration (white or blue). If you are given a sling to wear before you go home, please wear it  at all times until the block has completely worn off.  Do not get up at night without your sling. If you experience any problems or concerns, please contact your surgeon's office. If you experience severe or prolonged shortness of breath go to  the nearest emergency department.      AMBULATORY SURGERY  DISCHARGE INSTRUCTIONS   The drugs that you were given will stay in your system until tomorrow so for the next 24 hours you should not:  Drive an automobile Make any legal decisions Drink any alcoholic beverage   You may resume regular meals tomorrow.  Today it is better to start with liquids and gradually work up to solid foods.  You may eat anything you prefer, but it is better to start with liquids, then soup and crackers, and gradually work up to solid foods.   Please notify your doctor immediately if you have any unusual bleeding, trouble breathing, redness and pain at the surgery site, drainage, fever, or pain not relieved by medication.    Additional Instructions:        Please contact your physician with any problems or Same Day Surgery at [phone], Monday through Friday 6 am to 4 pm, or [place] at [facility] number at [phone]. SHOULDER SLING IMMOBILIZER   VIDEO Slingshot 2 Shoulder Brace  Application - YouTube ---[email or web address]  INSTRUCTIONS While supporting the injured arm, slide the forearm into the sling. Wrap the adjustable shoulder strap around the neck and shoulders and attach the strap end to the sling using  the alligator strap tab.  Adjust the shoulder strap to the required length. Position the shoulder pad behind the neck. To secure the shoulder pad location (optional), pull the shoulder strap away from the shoulder pad, unfold the hook material on the top of the pad, then press the shoulder strap back onto the hook material to secure the pad in place. Attach the closure strap across the open top of the sling. Position the strap so that it holds the arm securely in the sling. Next, attach the thumb strap to the open end of the sling between the thumb and fingers. After sling has been fit, it may be easily removed and reapplied using the quick release buckle on shoulder strap. If a neutral pillow or 15 abduction pillow is included, place the pillow at the waistline. Attach the sling to the pillow, lining up hook material on the pillow with the loop on sling. Adjust the waist strap to fit.  If waist strap is too long, cut it to fit. Use the small piece of double sided hook material (located on top of the pillow) to secure the strap end. Place the double sided hook material on the inside of the cut strap end and secure it to the waist strap.     If no pillow is included, attach the waist strap to the sling and adjust to fit.    Washing Instructions: Straps and sling must be removed and cleaned regularly depending on your activity level and perspiration. Hand wash straps and sling in cold water with mild detergent, rinse, air dry

## 2021-08-19 NOTE — Anesthesia Procedure Notes (Signed)
Procedure Name: Intubation Date/Time: 08/19/2021 7:40 AM Performed by: Lowry Bowl, CRNA Pre-anesthesia Checklist: Patient identified, Emergency Drugs available, Suction available and Patient being monitored Patient Re-evaluated:Patient Re-evaluated prior to induction Oxygen Delivery Method: Circle system utilized Preoxygenation: Pre-oxygenation with 100% oxygen Induction Type: IV induction and Cricoid Pressure applied Ventilation: Mask ventilation without difficulty Laryngoscope Size: McGraph and 4 Grade View: Grade I Tube type: Oral Tube size: 7.0 mm Number of attempts: 1 Airway Equipment and Method: Stylet and Video-laryngoscopy (McGrath used electively) Placement Confirmation: ETT inserted through vocal cords under direct vision, positive ETCO2 and breath sounds checked- equal and bilateral Secured at: 21 cm Tube secured with: Tape Dental Injury: Teeth and Oropharynx as per pre-operative assessment

## 2021-08-19 NOTE — Anesthesia Procedure Notes (Signed)
Anesthesia Regional Block: Interscalene brachial plexus block   Pre-Anesthetic Checklist: , timeout performed,  Correct Patient, Correct Site, Correct Laterality,  Correct Procedure, Correct Position, site marked,  Risks and benefits discussed,  Surgical consent,  Pre-op evaluation,  At surgeon's request and post-op pain management  Laterality: Right  Prep: chloraprep       Needles:  Injection technique: Single-shot  Needle Type: Echogenic Stimulator Needle     Needle Length: 10cm  Needle Gauge: 20     Additional Needles:   Procedures:, nerve stimulator,,, ultrasound used (permanent image in chart),,     Nerve Stimulator or Paresthesia:  Response: biceps flexion  Additional Responses:   Narrative:  Start time: 08/19/2021 7:21 AM End time: 08/19/2021 7:24 AM Injection made incrementally with aspirations every 5 mL.  Performed by: Personally   Additional Notes: Functioning IV was confirmed and monitors were applied.  . Sterile prep and drape,hand hygiene and sterile gloves were used.  Negative aspiration and negative test dose prior to incremental administration of local anesthetic. The patient tolerated the procedure well.

## 2021-08-19 NOTE — Op Note (Signed)
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  Post-Op Diagnosis:   Same  Procedure:   Reverse right total shoulder arthroplasty with biceps tenodesis.  Surgeon:   Pascal Lux, MD  Assistant:   Waylan Boga, RNFA; Cyd Silence, PA-S  Anesthesia:   General endotracheal with an interscalene block using Exparel placed preoperatively by the anesthesiologist.  Findings:   As above.  Complications:   None  EBL:   75 cc  Fluids:   1000 cc crystalloid  UOP:   None  TT:   None  Drains:   None  Closure:   Staples  Implants:   All press-fit [employer] Comprehensive system with a #12 micro-humeral stem, a 40 mm humeral tray with a standard insert, and a mini-base plate with a 36 mm glenosphere.  Brief Clinical Note:   The patient is a 76 year old male with a history of progressively worsening right shoulder pain and weakness. His symptoms have progressed despite medications, activity modification, etc. His history and examination were consistent with a massive rotator cuff tear with early cuff arthropathy, all of which were confirmed by his preoperative MRI scan. The patient presents at this time for a reverse right total shoulder arthroplasty.  Procedure:   The patient underwent placement of an interscalene block using Exparel by the anesthesiologist in the preoperative holding area before being brought into the operating room and lain in the supine position. The patient then underwent general endotracheal intubation and anesthesia before the patient was repositioned in the beach chair position using the beach chair positioner. The right shoulder and upper extremity were prepped with ChloraPrep solution before being draped sterilely. Preoperative antibiotics were administered. A timeout was performed to verify the appropriate surgical site.    A standard anterior approach to the shoulder was  made through an approximately 4-5 inch incision. The incision was carried down through the subcutaneous tissues to expose the deltopectoral fascia. The interval between the deltoid and pectoralis muscles was identified and this plane developed, retracting the cephalic vein laterally with the deltoid muscle. The conjoined tendon was identified. Its lateral margin was dissected and the Kolbel self-retraining retractor inserted. The "three sisters" were identified and cauterized. Bursal tissues were removed to improve visualization.   The biceps tendon was identified near the inferior aspect of the bicipital groove. A soft tissue tenodesis was performed by attaching the biceps tendon to the adjacent pectoralis major tendon using two #0 Ethibond interrupted sutures. The biceps tendon was then transected just proximal to the tenodesis site. The subscapularis tendon was released from its attachment to the lesser tuberosity 1 cm proximal to its insertion and several tagging sutures placed. The inferior capsule was released with care after identifying and protecting the axillary nerve. The proximal humeral cut was made at approximately 25 of retroversion using the extra-medullary guide.   Attention was redirected to the glenoid. The labrum was debrided circumferentially before the center of the glenoid was marked with electrocautery. The guidewire was drilled into the glenoid neck using the appropriate guide. After verifying its position, it was overreamed with the mini-baseplate reamer to create a flat surface. The permanent mini-baseplate was impacted into place. It was stabilized with a 40 x 6.5 mm central screw and four peripheral screws. Locking screws were placed superiorly and inferiorly while nonlocking screws were placed anteriorly and posteriorly. The permanent 36 mm glenosphere was then impacted into place and  its Morse taper locking mechanism verified using manual distraction.  Attention was directed to  the humeral side. The humeral canal was reamed sequentially beginning with the end-cutting reamer then progressing from a 4 mm reamer up to a 12 mm reamer. This provided excellent circumferential chatter. The canal was broached beginning with a #9 broach and progressing to a #12 broach. This was left in place and a trial reduction performed using the standard trial humeral platform. The arm demonstrated excellent range of motion as the hand could be brought across the chest to the opposite shoulder and brought to the top of the patient's head and to the patient's ear. The shoulder appeared stable throughout this range of motion. The joint was dislocated and the trial components removed. The permanent #12 micro-stem was impacted into place with care taken to maintain the appropriate version. The permanent 40 mm humeral platform with the standard insert was put together on the back table and impacted into place. Again, the [facility] taper locking mechanism was verified using manual distraction. The shoulder was relocated using two finger pressure and again placed through a range of motion with the findings as described above.  The wound was copiously irrigated with sterile saline solution using the jet lavage system before a total of 10 cc of Exparel and 30 cc of 0.5% Sensorcaine with epinephrine was injected into the pericapsular and peri-incisional tissues to help with postoperative analgesia. The subscapularis tendon was reapproximated using #2 FiberWire interrupted sutures. The deltopectoral interval was closed using #0 Vicryl interrupted sutures before the subcutaneous tissues were closed using 2-0 Vicryl interrupted sutures. The skin was closed using staples. Prior to closing the skin, 1 g of transexemic acid in 10 cc of normal saline was injected intra-articularly to help with postoperative bleeding. A sterile occlusive dressing was applied to the wound before the arm was placed into a shoulder immobilizer with  an abduction pillow. A Polar Care system also was applied to the shoulder. The patient was then transferred back to a hospital bed before being awakened, extubated, and returned to the recovery room in satisfactory condition after tolerating the procedure well.

## 2021-08-20 LAB — SURGICAL PATHOLOGY

## 2021-11-13 IMAGING — DX DG KNEE 1-2V*R*
2 series · 2 of 2 positions shown · non-contrast
Comparison: None.

CLINICAL DATA: Total RIGHT replacement

EXAM:
RIGHT KNEE - 1-2 VIEW

[knee ap]
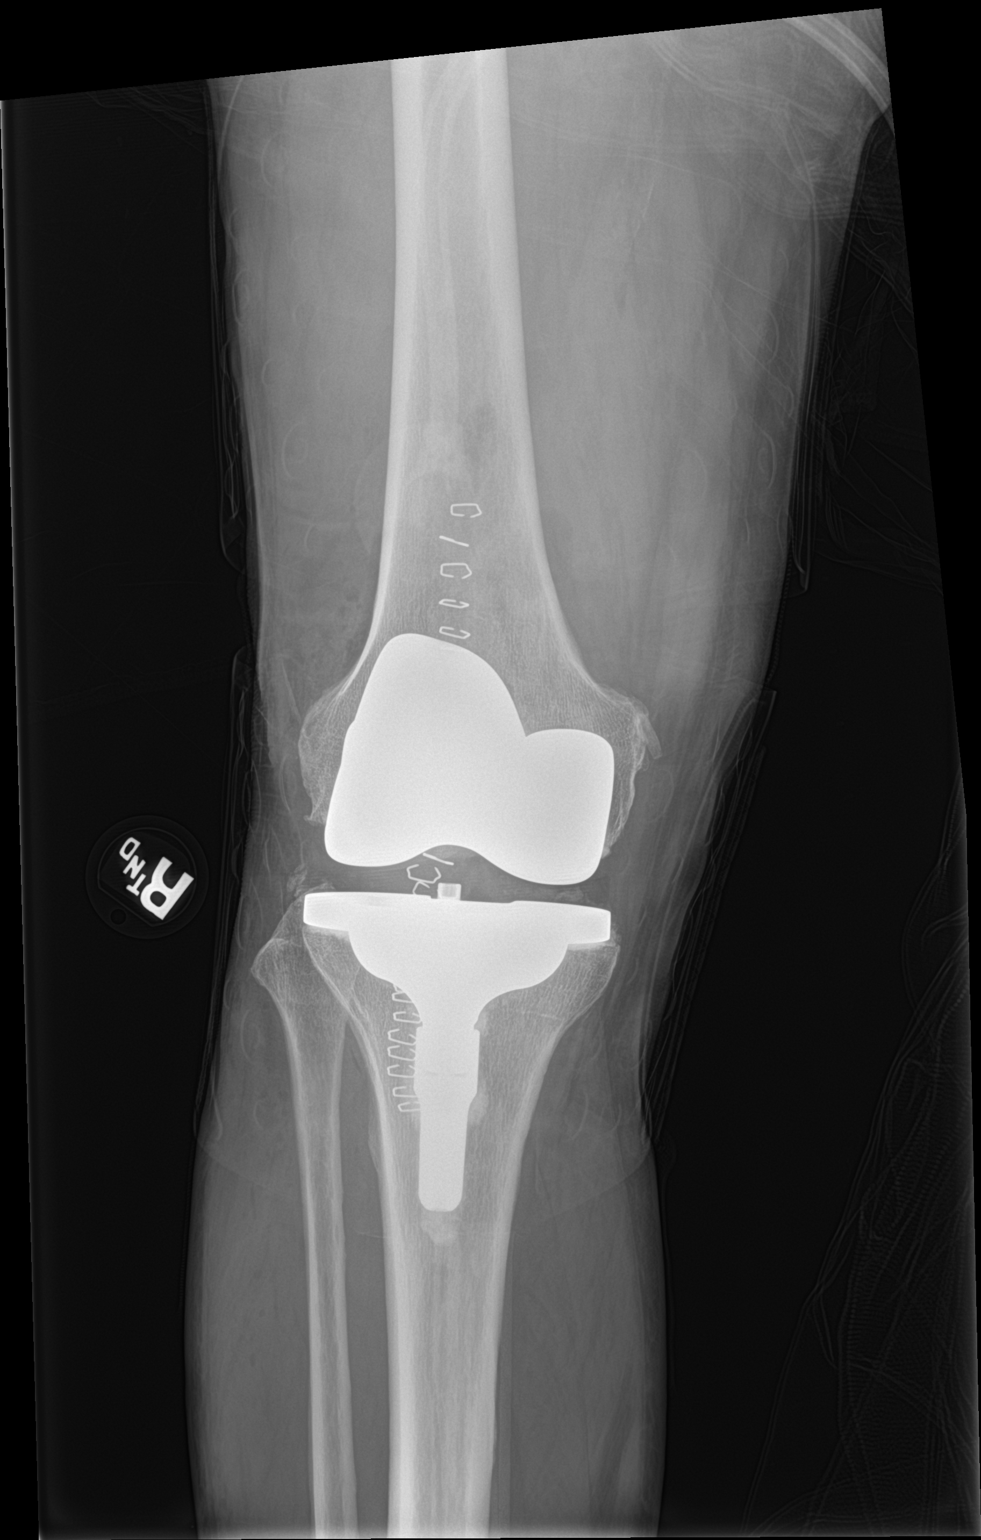

[knee lat]
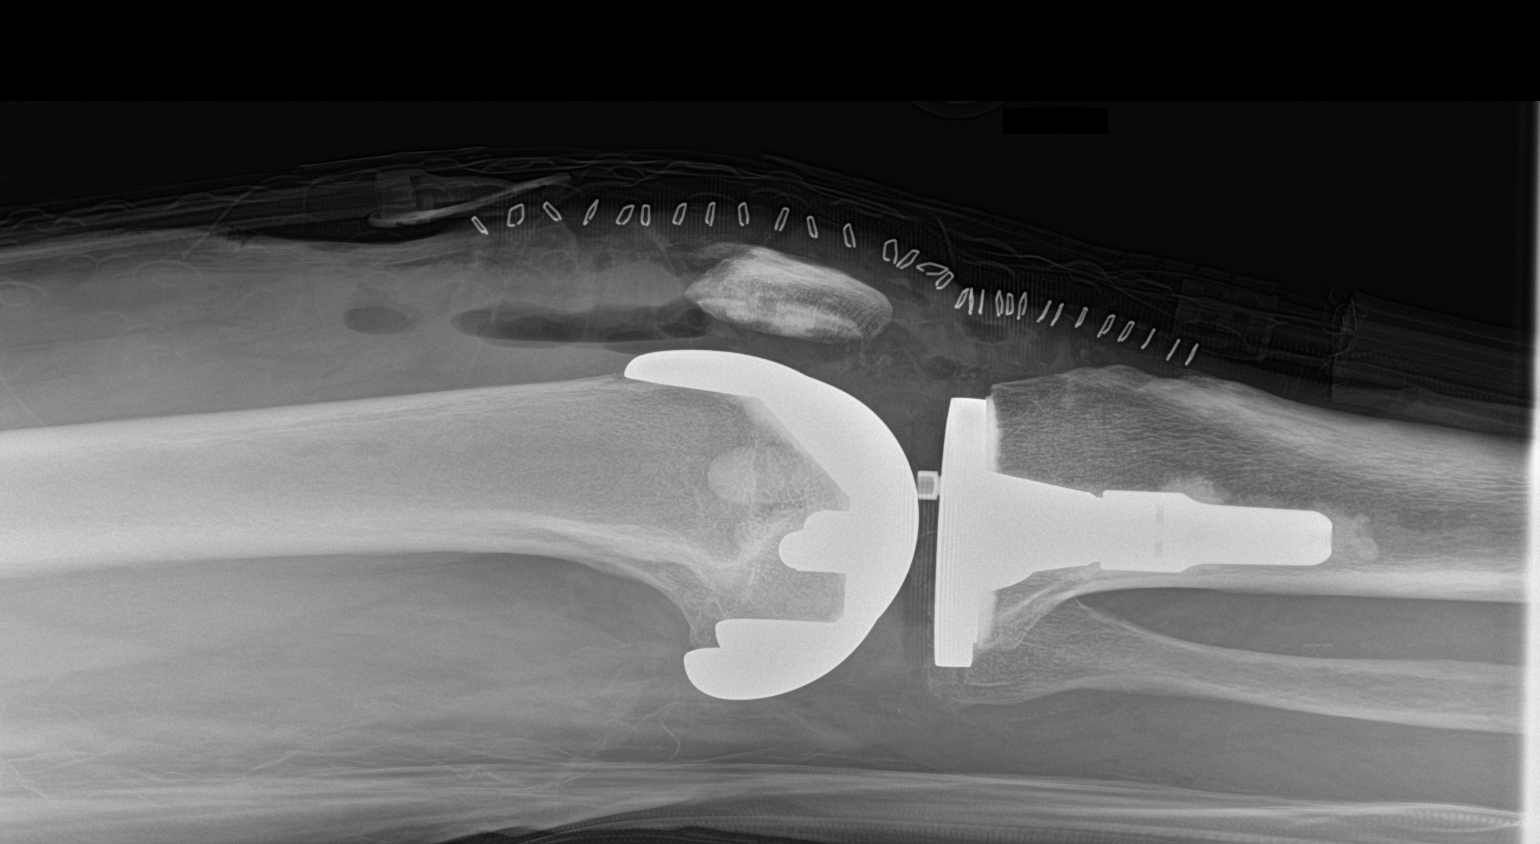

[2 of 2 positions shown; findings below may reference images not displayed]

FINDINGS: Total knee arthroplasty. Prosthetic components appear in good
position. Expected postsurgical soft tissue change. Skin staples
noted.
IMPRESSION: Total knee arthroplasty without complication

## 2023-01-16 IMAGING — DX DG SHOULDER 1V*R*
2 series · 2 of 2 positions shown · non-contrast
Comparison: 03/19/2021

CLINICAL DATA: Arthroplasty of right shoulder

EXAM:
RIGHT SHOULDER - 1 VIEW

[shoulder ap]
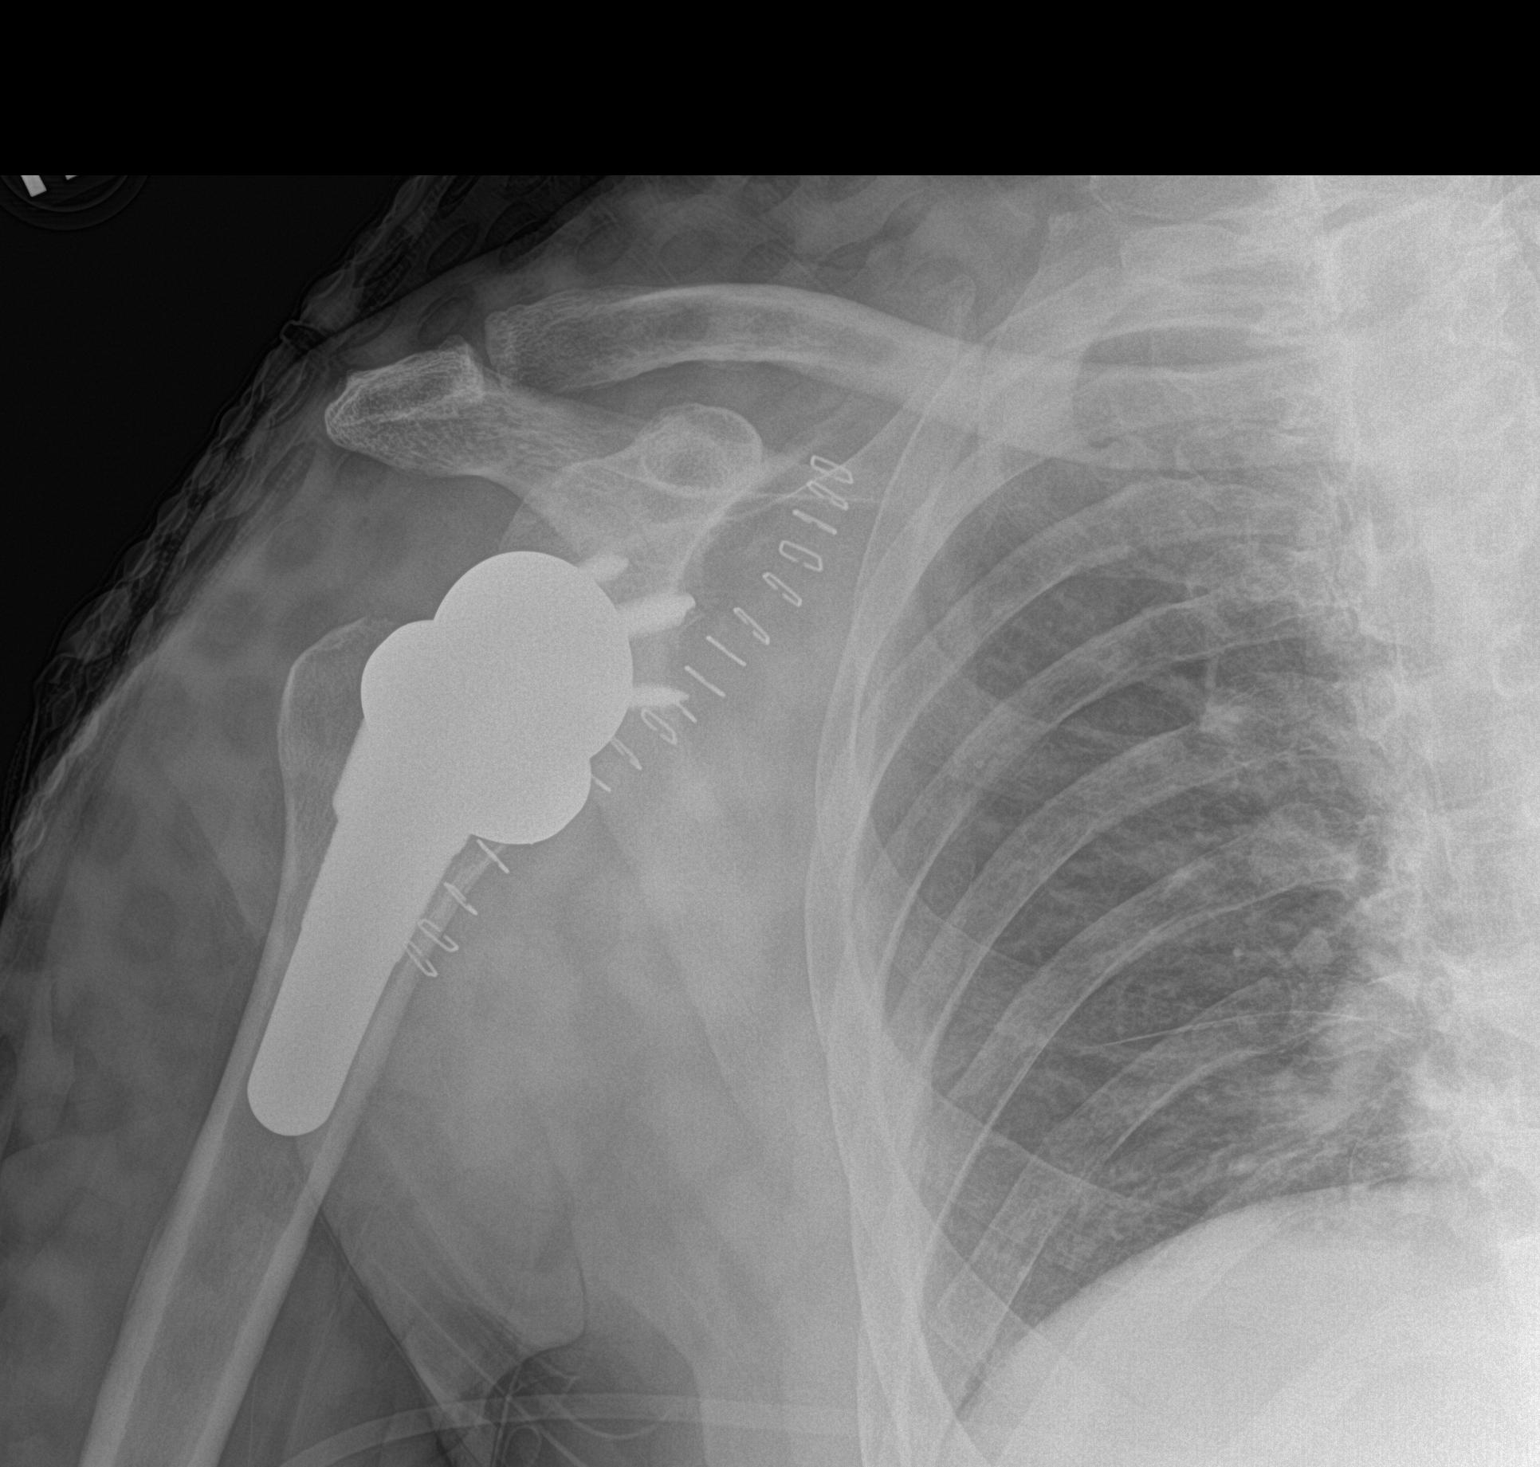

[shoulder obl]
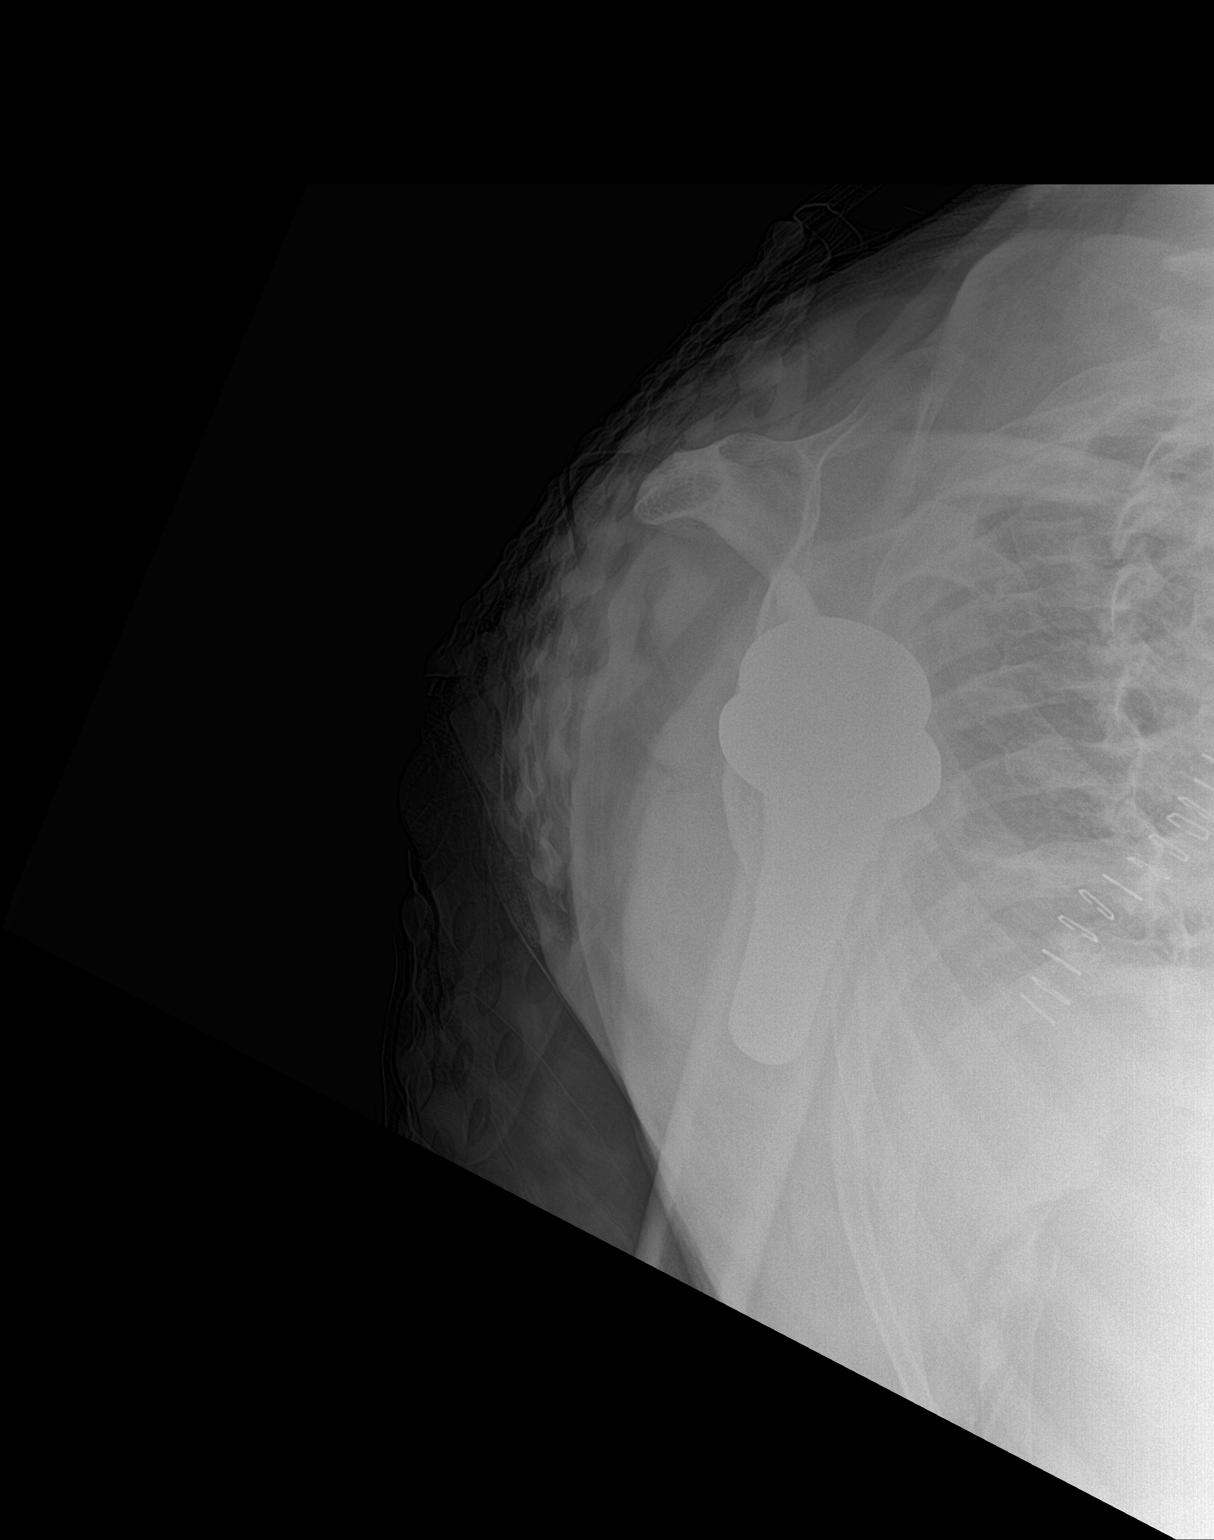

[2 of 2 positions shown; findings below may reference images not displayed]

FINDINGS: There is evidence of right shoulder arthroplasty since the previous
study. Skin staples are noted.
IMPRESSION: Interval right shoulder arthroplasty.
